# Patient Record
Sex: Female | Born: 1985 | Race: Black or African American | Hispanic: No | Marital: Single | Smoking: Never smoker
Health system: Southern US, Community
[De-identification: ages and names within clinical notes are randomized; demographics above are authoritative.]

## PROBLEM LIST (undated history)

## (undated) ENCOUNTER — Emergency Department (HOSPITAL_COMMUNITY): Admission: EM | Payer: Medicaid Other

## (undated) DIAGNOSIS — F329 Major depressive disorder, single episode, unspecified: Secondary | ICD-10-CM

## (undated) DIAGNOSIS — F32A Depression, unspecified: Secondary | ICD-10-CM

## (undated) HISTORY — DX: Major depressive disorder, single episode, unspecified: F32.9

## (undated) HISTORY — DX: Depression, unspecified: F32.A

## (undated) HISTORY — PX: DILATION AND CURETTAGE OF UTERUS: SHX78

## (undated) HISTORY — PX: KNEE ARTHROSCOPY WITH MEDIAL MENISECTOMY: SHX5651

---

## 2018-04-02 ENCOUNTER — Encounter: Payer: Self-pay | Admitting: Sports Medicine

## 2018-04-02 ENCOUNTER — Ambulatory Visit (INDEPENDENT_AMBULATORY_CARE_PROVIDER_SITE_OTHER): Payer: Medicaid Other | Admitting: Sports Medicine

## 2018-04-02 ENCOUNTER — Ambulatory Visit (INDEPENDENT_AMBULATORY_CARE_PROVIDER_SITE_OTHER): Payer: Medicaid Other

## 2018-04-02 DIAGNOSIS — M23203 Derangement of unspecified medial meniscus due to old tear or injury, right knee: Secondary | ICD-10-CM

## 2018-04-02 DIAGNOSIS — M25561 Pain in right knee: Secondary | ICD-10-CM | POA: Diagnosis not present

## 2018-04-02 DIAGNOSIS — M1711 Unilateral primary osteoarthritis, right knee: Secondary | ICD-10-CM | POA: Insufficient documentation

## 2018-04-02 DIAGNOSIS — M25562 Pain in left knee: Secondary | ICD-10-CM | POA: Diagnosis not present

## 2018-04-02 MED ORDER — MELOXICAM 15 MG PO TABS: ORAL_TABLET | ORAL | 3 refills | Status: DC

## 2018-04-02 NOTE — Progress Notes (Signed)
Subjective:    CC: Right knee pain  HPI:  This is a very pleasant 33 year old female, she used to live in [place].  She was ice-skating, she fell and twisted her right knee.  With persistent pain, swelling, ultimately she was diagnosed with a meniscal tear and underwent a an arthroscopic partial meniscectomy.  Since then she has had persistent discomfort along the medial joint line, occasional mechanical symptoms and buckling.  She has tried some prescription strength ibuprofen with only minimal relief.  She was told by her surgeon that she did have some areas of cartilage loss during the arthroscopy.  I reviewed the past medical history, family history, social history, surgical history, and allergies today and no changes were needed.  Please see the problem list section below in epic for further details.  Past Medical History: Past Medical History:  Diagnosis Date  . Depression    Past Surgical History: History reviewed. No pertinent surgical history. Social History: Social History   Socioeconomic History  . Marital status: Single    Spouse name: Not on file  . Number of children: Not on file  . Years of education: Not on file  . Highest education level: Not on file  Occupational History  . Not on file  Social Needs  . Financial resource strain: Not on file  . Food insecurity:    Worry: Not on file    Inability: Not on file  . Transportation needs:    Medical: Not on file    Non-medical: Not on file  Tobacco Use  . Smoking status: Never Smoker  . Smokeless tobacco: Never Used  Substance and Sexual Activity  . Alcohol use: Not Currently  . Drug use: Not Currently  . Sexual activity: Not on file  Lifestyle  . Physical activity:    Days per week: Not on file    Minutes per session: Not on file  . Stress: Not on file  Relationships  . Social connections:    Talks on phone: Not on file    Gets together: Not on file    Attends religious service: Not on file   Active member of club or organization: Not on file    Attends meetings of clubs or organizations: Not on file    Relationship status: Not on file  Other Topics Concern  . Not on file  Social History Narrative  . Not on file   Family History: No family history on file. Allergies: No Known Allergies Medications: See med rec.  Review of Systems: No headache, visual changes, nausea, vomiting, diarrhea, constipation, dizziness, abdominal pain, skin rash, fevers, chills, night sweats, swollen lymph nodes, weight loss, chest pain, body aches, joint swelling, muscle aches, shortness of breath, mood changes, visual or auditory hallucinations.  Objective:    General: Well Developed, well nourished, and in no acute distress.  Neuro: Alert and oriented x3, extra-ocular muscles intact, sensation grossly intact.  HEENT: Normocephalic, atraumatic, pupils equal round reactive to light, neck supple, no masses, no lymphadenopathy, thyroid nonpalpable.  Skin: Warm and dry, no rashes noted.  Cardiac: Regular rate and rhythm, no murmurs rubs or gallops.  Respiratory: Clear to auscultation bilaterally. Not using accessory muscles, speaking in full sentences.  Abdominal: Soft, nontender, nondistended, positive bowel sounds, no masses, no organomegaly.  Right knee: Visibly swollen with no actual fluid wave. She is tender to palpation of the medial joint line, I am able to reproduce her pain with terminal flexion consistent with a meniscal tear. ROM  normal in flexion and extension and lower leg rotation. Ligaments with solid consistent endpoints including ACL, PCL, LCL, MCL. Non painful patellar compression. Patellar and quadriceps tendons unremarkable. Hamstring and quadriceps strength is normal.  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  Impression and Recommendations:    The patient was counselled, risk factors were discussed, anticipatory  guidance given.  Right knee meniscal tear History of right knee partial meniscectomy. 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 She is having some mechanical symptoms we are going to proceed with an x-ray and an MRI, adding meloxicam, home rehab exercises. 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 Return to see me in a month.  ___________________________________________ Ihor Austinhomas J. Benjamin Stainhekkekandam, M.D., ABFM., CAQSM. Primary Care and Sports Medicine [facility]  Adjunct Professor of Family Medicine  [employer]

## 2018-04-02 NOTE — Assessment & Plan Note (Addendum)
History of right knee partial meniscectomy. 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 She is having some mechanical symptoms we are going to proceed with an x-ray and an MRI, adding meloxicam, home rehab exercises. 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 Return to see me in a month.

## 2018-04-23 ENCOUNTER — Ambulatory Visit (INDEPENDENT_AMBULATORY_CARE_PROVIDER_SITE_OTHER): Payer: Medicaid Other

## 2018-04-23 DIAGNOSIS — M23203 Derangement of unspecified medial meniscus due to old tear or injury, right knee: Secondary | ICD-10-CM

## 2018-04-30 ENCOUNTER — Ambulatory Visit: Payer: Medicaid Other | Admitting: Sports Medicine

## 2018-05-09 ENCOUNTER — Encounter: Payer: Self-pay | Admitting: Sports Medicine

## 2018-05-09 ENCOUNTER — Ambulatory Visit (INDEPENDENT_AMBULATORY_CARE_PROVIDER_SITE_OTHER): Payer: Medicaid Other | Admitting: Sports Medicine

## 2018-05-09 DIAGNOSIS — M1711 Unilateral primary osteoarthritis, right knee: Secondary | ICD-10-CM | POA: Diagnosis not present

## 2018-05-09 NOTE — Progress Notes (Signed)
Subjective:    CC: Recheck knee  HPI: This is a very pleasant 33 year old female, she is here for follow-up of her right knee pain, she is post partial meniscectomy, had some medial joint line pain, we obtained an MRI.  There was no visible meniscal tearing, she did have some osteoarthritis.  Oral NSAIDs are not effective, activity modification and some weight loss have not helped, pain is moderate, persistent, localized to the medial joint line without radiation or mechanical symptoms.  I reviewed the past medical history, family history, social history, surgical history, and allergies today and no changes were needed.  Please see the problem list section below in epic for further details.  Past Medical History: Past Medical History:  Diagnosis Date  . Depression    Past Surgical History: Past Surgical History:  Procedure Laterality Date  . KNEE ARTHROSCOPY WITH MEDIAL MENISECTOMY Right    Social History: Social History   Socioeconomic History  . Marital status: Single    Spouse name: Not on file  . Number of children: Not on file  . Years of education: Not on file  . Highest education level: Not on file  Occupational History  . Not on file  Social Needs  . Financial resource strain: Not on file  . Food insecurity:    Worry: Not on file    Inability: Not on file  . Transportation needs:    Medical: Not on file    Non-medical: Not on file  Tobacco Use  . Smoking status: Never Smoker  . Smokeless tobacco: Never Used  Substance and Sexual Activity  . Alcohol use: Not Currently  . Drug use: Not Currently  . Sexual activity: Not on file  Lifestyle  . Physical activity:    Days per week: Not on file    Minutes per session: Not on file  . Stress: Not on file  Relationships  . Social connections:    Talks on phone: Not on file    Gets together: Not on file    Attends religious service: Not on file    Active member of club or organization: Not on file    Attends  meetings of clubs or organizations: Not on file    Relationship status: Not on file  Other Topics Concern  . Not on file  Social History Narrative  . Not on file   Family History: No family history on file. Allergies: No Known Allergies Medications: See med rec.  Review of Systems: No fevers, chills, night sweats, weight loss, chest pain, or shortness of breath.   Objective:    General: Well Developed, well nourished, and in no acute distress.  Neuro: Alert and oriented x3, extra-ocular muscles intact, sensation grossly intact.  HEENT: Normocephalic, atraumatic, pupils equal round reactive to light, neck supple, no masses, no lymphadenopathy, thyroid nonpalpable.  Skin: Warm and dry, no rashes. Cardiac: Regular rate and rhythm, no murmurs rubs or gallops, no lower extremity edema.  Respiratory: Clear to auscultation bilaterally. Not using accessory muscles, speaking in full sentences. Right knee: Normal to inspection with no erythema or effusion or obvious bony abnormalities. Tender to palpation at medial joint line ROM normal in flexion and extension and lower leg rotation. Ligaments with solid consistent endpoints including ACL, PCL, LCL, MCL. Negative Mcmurray's and provocative meniscal tests. Non painful patellar compression. Patellar and quadriceps tendons unremarkable. Hamstring and quadriceps strength is normal.  Procedure: Real-time Ultrasound Guided Injection of right knee Device: GE Logiq E  Verbal informed consent  obtained.  Time-out conducted.  Noted no overlying erythema, induration, or other signs of local infection.  Skin prepped in a sterile fashion.  Local anesthesia: Topical Ethyl chloride.  With sterile technique and under real time ultrasound guidance: 1 cc Kenalog 40, 2 cc lidocaine, 2 cc bupivacaine injected easily Completed without difficulty  Pain immediately resolved suggesting accurate placement of the medication.  Advised to call if fevers/chills,  erythema, induration, drainage, or persistent bleeding.  Images permanently stored and available for review in the ultrasound unit.  Impression: Technically successful ultrasound guided injection.  Impression and Recommendations:    Primary osteoarthritis of right knee History of a right knee partial meniscectomy, also had some post arthroscopy chondromalacia. No meniscal tearing on MRI. This is early osteoarthritis, injection today. Return in 1 month. ___________________________________________ Ihor Austin. Benjamin Stain, M.D., ABFM., CAQSM. Primary Care and Sports Medicine [facility]  Adjunct Professor of Family Medicine  [employer]

## 2018-05-09 NOTE — Assessment & Plan Note (Signed)
History of a right knee partial meniscectomy, also had some post arthroscopy chondromalacia. No meniscal tearing on MRI. This is early osteoarthritis, injection today. Return in 1 month.

## 2018-06-06 ENCOUNTER — Ambulatory Visit: Payer: Medicaid Other | Admitting: Sports Medicine

## 2019-02-06 ENCOUNTER — Other Ambulatory Visit: Payer: Self-pay

## 2019-02-07 ENCOUNTER — Other Ambulatory Visit: Payer: Self-pay

## 2019-02-07 DIAGNOSIS — Z20822 Contact with and (suspected) exposure to covid-19: Secondary | ICD-10-CM

## 2019-02-10 LAB — NOVEL CORONAVIRUS, NAA: SARS-CoV-2, NAA: NOT DETECTED

## 2019-09-17 DIAGNOSIS — H5213 Myopia, bilateral: Secondary | ICD-10-CM | POA: Diagnosis not present

## 2019-09-17 DIAGNOSIS — H1013 Acute atopic conjunctivitis, bilateral: Secondary | ICD-10-CM | POA: Diagnosis not present

## 2019-09-19 ENCOUNTER — Encounter (HOSPITAL_COMMUNITY): Payer: Self-pay | Admitting: Emergency Medicine

## 2019-09-19 ENCOUNTER — Other Ambulatory Visit: Payer: Self-pay

## 2019-09-19 ENCOUNTER — Emergency Department (HOSPITAL_COMMUNITY)
Admission: EM | Admit: 2019-09-19 | Discharge: 2019-09-20 | Disposition: A | Discharge status: Other Acute Inpatient Hospital | Payer: Medicaid Other | Attending: Emergency Medicine | Admitting: Emergency Medicine

## 2019-09-19 ENCOUNTER — Emergency Department (HOSPITAL_COMMUNITY): Payer: Medicaid Other

## 2019-09-19 DIAGNOSIS — R6510 Systemic inflammatory response syndrome (SIRS) of non-infectious origin without acute organ dysfunction: Secondary | ICD-10-CM | POA: Insufficient documentation

## 2019-09-19 DIAGNOSIS — K047 Periapical abscess without sinus: Secondary | ICD-10-CM

## 2019-09-19 DIAGNOSIS — K122 Cellulitis and abscess of mouth: Secondary | ICD-10-CM | POA: Diagnosis not present

## 2019-09-19 DIAGNOSIS — Z20822 Contact with and (suspected) exposure to covid-19: Secondary | ICD-10-CM | POA: Insufficient documentation

## 2019-09-19 DIAGNOSIS — K0889 Other specified disorders of teeth and supporting structures: Secondary | ICD-10-CM | POA: Diagnosis present

## 2019-09-19 LAB — BASIC METABOLIC PANEL
Anion gap: 10 (ref 5–15)
BUN: 9 mg/dL (ref 6–20)
CO2: 26 mmol/L (ref 22–32)
Calcium: 8.8 mg/dL — ABNORMAL LOW (ref 8.9–10.3)
Chloride: 102 mmol/L (ref 98–111)
Creatinine, Ser: 0.96 mg/dL (ref 0.44–1.00)
GFR calc Af Amer: >60 mL/min (ref >60)
GFR calc non Af Amer: >60 mL/min (ref >60)
Glucose, Bld: 98 mg/dL (ref 70–99)
Potassium: 3.2 mmol/L — ABNORMAL LOW (ref 3.5–5.1)
Sodium: 138 mmol/L (ref 135–145)

## 2019-09-19 LAB — CBC WITH DIFFERENTIAL/PLATELET
Abs Immature Granulocytes: 0.04 10*3/uL (ref 0.00–0.07)
Basophils Absolute: 0 10*3/uL (ref 0.0–0.1)
Basophils Relative: 0 %
Eosinophils Absolute: 0 10*3/uL (ref 0.0–0.5)
Eosinophils Relative: 0 %
HCT: 33.9 % — ABNORMAL LOW (ref 36.0–46.0)
Hemoglobin: 11.1 g/dL — ABNORMAL LOW (ref 12.0–15.0)
Immature Granulocytes: 0 %
Lymphocytes Relative: 8 %
Lymphs Abs: 0.9 10*3/uL (ref 0.7–4.0)
MCH: 23.9 pg — ABNORMAL LOW (ref 26.0–34.0)
MCHC: 32.7 g/dL (ref 30.0–36.0)
MCV: 73.1 fL — ABNORMAL LOW (ref 80.0–100.0)
Monocytes Absolute: 0.9 10*3/uL (ref 0.1–1.0)
Monocytes Relative: 8 %
Neutro Abs: 9.9 10*3/uL — ABNORMAL HIGH (ref 1.7–7.7)
Neutrophils Relative %: 84 %
Platelets: 258 10*3/uL (ref 150–400)
RBC: 4.64 MIL/uL (ref 3.87–5.11)
RDW: 13.3 % (ref 11.5–15.5)
WBC: 11.8 10*3/uL — ABNORMAL HIGH (ref 4.0–10.5)
nRBC: 0 % (ref 0.0–0.2)

## 2019-09-19 LAB — URINALYSIS, ROUTINE W REFLEX MICROSCOPIC
Bilirubin Urine: NEGATIVE
Glucose, UA: NEGATIVE mg/dL
Ketones, ur: 20 mg/dL — AB
Leukocytes,Ua: NEGATIVE
Nitrite: NEGATIVE
Protein, ur: NEGATIVE mg/dL
Specific Gravity, Urine: >1.046 — ABNORMAL HIGH (ref 1.005–1.030)
pH: 6 (ref 5.0–8.0)

## 2019-09-19 LAB — PREGNANCY, URINE: Preg Test, Ur: NEGATIVE

## 2019-09-19 LAB — LACTIC ACID, PLASMA
Lactic Acid, Venous: 1 mmol/L (ref 0.5–1.9)
Lactic Acid, Venous: 0.8 mmol/L (ref 0.5–1.9)

## 2019-09-19 MED ORDER — SODIUM CHLORIDE 0.9 % IV BOLUS
1000.0000 mL | Freq: Once | INTRAVENOUS | Status: AC
  Administered 2019-09-19: 1000 mL via INTRAVENOUS

## 2019-09-19 MED ORDER — ACETAMINOPHEN 325 MG PO TABS
650.0000 mg | ORAL_TABLET | Freq: Once | ORAL | Status: AC
  Administered 2019-09-19: 650 mg via ORAL
  Filled 2019-09-19: qty 2

## 2019-09-19 MED ORDER — IOHEXOL 300 MG/ML  SOLN
75.0000 mL | Freq: Once | INTRAMUSCULAR | Status: AC | PRN
  Administered 2019-09-19: 75 mL via INTRAVENOUS

## 2019-09-19 MED ORDER — MORPHINE SULFATE (PF) 4 MG/ML IV SOLN
4.0000 mg | Freq: Once | INTRAVENOUS | Status: AC
  Administered 2019-09-19: 4 mg via INTRAVENOUS
  Filled 2019-09-19: qty 1

## 2019-09-19 MED ORDER — MORPHINE SULFATE (PF) 4 MG/ML IV SOLN
4.0000 mg | INTRAVENOUS | Status: DC | PRN
  Administered 2019-09-20 (×2): 4 mg via INTRAVENOUS
  Filled 2019-09-19 (×2): qty 1

## 2019-09-19 MED ORDER — CLINDAMYCIN PHOSPHATE 600 MG/50ML IV SOLN
600.0000 mg | Freq: Once | INTRAVENOUS | Status: AC
  Administered 2019-09-19: 600 mg via INTRAVENOUS
  Filled 2019-09-19: qty 50

## 2019-09-19 MED ORDER — CLINDAMYCIN PHOSPHATE 600 MG/50ML IV SOLN: 600.0000 mg | Freq: Four times a day (QID) | INTRAVENOUS | Status: DC

## 2019-09-19 NOTE — ED Notes (Signed)
Patient transported to CT 

## 2019-09-19 NOTE — ED Provider Notes (Signed)
[facility] EMERGENCY DEPARTMENT Provider Note   CSN: [id number] Arrival date & time: 09/19/19  1641     History Chief Complaint  Patient presents with  . Dental Pain    Latasha Carlson is a 34 y.o. female.  She has no significant past medical history.  She is complaining of 6 days of left lower molar pain.  She saw dentist and got put on amoxicillin and pain medicine and referred on to oral surgery.  Says she can get into oral surgery as 3 weeks.  Complaining of increased pain and swelling onto her neck.  Pain with swallowing.  Handling her oral secretions.  Fever here of 101.7.  The history is provided by the patient.  Dental Pain Location:  Lower Lower teeth location:  19/LL 1st molar and 18/LL 2nd molar Quality:  Aching Severity:  Severe Onset quality:  Gradual Duration:  6 days Timing:  Constant Progression:  Worsening Chronicity:  New Context: abscess and dental fracture   Previous work-up:  Dental exam Relieved by:  Nothing Worsened by:  Jaw movement and pressure Ineffective treatments: abx, pain meds. Associated symptoms: difficulty swallowing, fever, neck pain and neck swelling   Associated symptoms: no drooling, no headaches and no trismus        Past Medical History:  Diagnosis Date  . Depression     Patient Active Problem List   Diagnosis Date Noted  . Primary osteoarthritis of right knee 04/02/2018    Past Surgical History:  Procedure Laterality Date  . KNEE ARTHROSCOPY WITH MEDIAL MENISECTOMY Right      OB History   No obstetric history on file.     No family history on file.  Social History   Tobacco Use  . Smoking status: Never Smoker  . Smokeless tobacco: Never Used  Substance Use Topics  . Alcohol use: Not Currently  . Drug use: Not Currently    Home Medications Prior to Admission medications   Medication Sig Start Date End Date Taking? Authorizing Provider  meloxicam (MOBIC) 15 MG tablet One tab PO qAM with  breakfast for 2 weeks, then daily prn pain. 04/02/18   Monica Becton, MD    Allergies    Patient has no known allergies.  Review of Systems   Review of Systems  Constitutional: Positive for fever.  HENT: Positive for dental problem and trouble swallowing. Negative for drooling.   Eyes: Negative for visual disturbance.  Respiratory: Negative for shortness of breath.   Cardiovascular: Negative for chest pain.  Gastrointestinal: Negative for abdominal pain.  Genitourinary: Negative for dysuria.  Musculoskeletal: Positive for neck pain.  Skin: Negative for rash.  Neurological: Negative for headaches.    Physical Exam Updated Vital Signs BP 125/87 (BP Location: Left Arm)   Pulse (!) 103   Temp (!) 101.7 F (38.7 C) (Oral)   Resp 16   Ht 5\' 8"  (1.727 m)   SpO2 100%   BMI 28.89 kg/m   Physical Exam Vitals and nursing note reviewed.  Constitutional:      General: She is not in acute distress.    Appearance: She is well-developed.  HENT:     Head: Normocephalic and atraumatic.     Mouth/Throat:     Mouth: Mucous membranes are moist.   Eyes:     Conjunctiva/sclera: Conjunctivae normal.  Neck:     Trachea: Trachea normal.   Cardiovascular:     Rate and Rhythm: Regular rhythm. Tachycardia present.     Heart  sounds: No murmur heard.   Pulmonary:     Effort: Pulmonary effort is normal. No respiratory distress.     Breath sounds: Normal breath sounds.  Abdominal:     Palpations: Abdomen is soft.     Tenderness: There is no abdominal tenderness.  Musculoskeletal:        General: No deformity or signs of injury. Normal range of motion.     Cervical back: No rigidity.  Skin:    General: Skin is warm and dry.  Neurological:     General: No focal deficit present.     Mental Status: She is alert.     Sensory: No sensory deficit.     Motor: No weakness.     Gait: Gait normal.     ED Results / Procedures / Treatments   Labs (all labs ordered are listed, but  only abnormal results are displayed) Labs Reviewed  BASIC METABOLIC PANEL - Abnormal; Notable for the following components:      Result Value   Potassium 3.2 (*)    Calcium 8.8 (*)    All other components within normal limits  CBC WITH DIFFERENTIAL/PLATELET - Abnormal; Notable for the following components:   WBC 11.8 (*)    Hemoglobin 11.1 (*)    HCT 33.9 (*)    MCV 73.1 (*)    MCH 23.9 (*)    Neutro Abs 9.9 (*)    All other components within normal limits  SARS CORONAVIRUS 2 BY RT PCR (HOSPITAL ORDER, PERFORMED IN [place] [facility])  LACTIC ACID, PLASMA  LACTIC ACID, PLASMA  PREGNANCY, URINE  URINALYSIS, ROUTINE W REFLEX MICROSCOPIC    EKG None  Radiology CT Soft Tissue Neck W Contrast  Result Date: 09/19/2019 CLINICAL DATA:  Initial evaluation for sublingual/submandibular abscess. EXAM: CT NECK WITH CONTRAST TECHNIQUE: Multidetector CT imaging of the neck was performed using the standard protocol following the bolus administration of intravenous contrast. CONTRAST:  38mL OMNIPAQUE IOHEXOL 300 MG/ML  SOLN COMPARISON:  None available. FINDINGS: Pharynx and larynx: Prominent dental carie with periapical lucency seen involving the left second mandibular molar. Associated focal dehiscence of the adjacent alveolar ridge along the lingual aspect of the mandible (series 6, image 34). There is a hypodense somewhat tubular collection emanating from the left mandibular body at this level, extending inferiorly and medially into the floor of mouth (series 7, images 40, 42, 46). The dominant abscess position along the midline at the floor of mouth measures 2.8 x 1.8 x 0.8 cm (AP by transverse by craniocaudad, series 3, image 51). Surrounding swelling with inflammatory changes within the adjacent floor of mouth and sublingual space, extending into the submandibular spaces bilaterally, left slightly worse than right. The oral tongue appears somewhat lifted. Hazy inflammatory stranding involves  the submental region as well. Palatine tonsils symmetric and within normal limits. Parapharyngeal fat relatively well maintained. Remainder of the oropharynx and nasopharynx within normal limits. No retropharyngeal collection or swelling. Epiglottis normal. Vallecula clear. Remainder of the hypopharynx and supraglottic larynx within normal limits. Glottis normal. Subglottic airway widely patent and clear. Salivary glands: Parotid glands within normal limits. Hazy inflammatory stranding surrounds the submandibular glands bilaterally related to the adjacent inflammatory process at the floor of mouth. The glands themselves are within normal limits without evidence for sialolithiasis or acute sialoadenitis. Thyroid: Normal. Lymph nodes: Prominent left level IB/II nodes measure up to 12 mm. Prominent submental nodes measure up to 7 mm. Findings presumably reactive. No other pathologically enlarged lymph nodes  seen within the neck. Vascular: Normal intravascular enhancement seen throughout the neck. Limited intracranial: Unremarkable. Visualized orbits: Visualized globes and orbital soft tissues within normal limits. Mastoids and visualized paranasal sinuses: Visualized paranasal sinuses are largely clear. Visualize mastoids and middle ear cavities are well pneumatized and free of fluid. Skeleton: No acute osseous abnormality. No discrete or worrisome osseous lesions. Upper chest: Visualized upper chest demonstrates no acute finding. Partially visualized lungs are clear. Other: None. IMPRESSION: 1. Prominent dental carie with periapical lucency involving the left second mandibular molar. Associated 2.8 x 1.8 x 0.8 cm abscess emanating from the base of the carious tooth, extending inferiorly and medially into the floor of mouth as above. Surrounding swelling with inflammatory changes within the adjacent floor of mouth and sublingual space, extending into the submandibular spaces and submental region bilaterally. 2. Mildly  enlarged left-sided cervical adenopathy as above, presumably reactive. Electronically Signed   By: Rise Mu M.D.   On: 09/19/2019 22:25    Procedures .Critical Care Performed by: Terrilee Files, MD Authorized by: Terrilee Files, MD   Critical care provider statement:    Critical care time (minutes):  45   Critical care time was exclusive of:  Separately billable procedures and treating other patients   Critical care was necessary to treat or prevent imminent or life-threatening deterioration of the following conditions: possible airway compromise, SIRS.   Critical care was time spent personally by me on the following activities:  Discussions with consultants, evaluation of patient's response to treatment, examination of patient, ordering and performing treatments and interventions, ordering and review of laboratory studies, ordering and review of radiographic studies, pulse oximetry, re-evaluation of patient's condition, obtaining history from patient or surrogate, review of old charts and development of treatment plan with patient or surrogate   I assumed direction of critical care for this patient from another provider in my specialty: no     (including critical care time)  Medications Ordered in ED Medications  sodium chloride 0.9 % bolus 1,000 mL (0 mLs Intravenous Stopped 09/19/19 2255)  morphine 4 MG/ML injection 4 mg (4 mg Intravenous Given 09/19/19 2130)  clindamycin (CLEOCIN) IVPB 600 mg (0 mg Intravenous Stopped 09/19/19 2255)  acetaminophen (TYLENOL) tablet 650 mg (650 mg Oral Given 09/19/19 2132)  iohexol (OMNIPAQUE) 300 MG/ML solution 75 mL (75 mLs Intravenous Contrast Given 09/19/19 2143)    ED Course  I have reviewed the triage vital signs and the nursing notes.  Pertinent labs & imaging results that were available during my care of the patient were reviewed by me and considered in my medical decision making (see chart for details).  Clinical Course as of Sep 19 2335  Thu Sep 19, 2019  2300 There is no oral surgery available on call tonight here.  Discussed with [facility] Dr. Delene Ruffini, ENT.  He says they also do not have neurosurgery and they have no ENT beds to admit the patient to.   [MB]  2316 Discussed with neurosurgery on-call Dr. Retta Mac. He said he would see the patient in consult if the patient could be admitted to the hospitalist service [facility].   [MB]  2320 Discussed with Dr. Elton Sin neurosurgery. She agrees that the patient needs a drainage. They currently only have 1 open bed on campus. She said if [facility] is able to accommodate the patient then that would be desirable. Call back if any changes.   [MB]  2336 Discussed with Dr. Lucianne Muss hospitalist  at [facility].  He is excepting the patient to his service.  Patient be transferred when bed is available.  He is asking that we continue clindamycin 600 every 6 hours.   [MB]  2336 I have signed out this patient to Dr. Oletta CohnPolina with plan for serial observation until [facility] to [facility].   [MB]    Clinical Course User Index [MB] Terrilee FilesButler, Deztinee Lohmeyer C, MD   MDM Rules/Calculators/A&P                         This patient complains of dental infection with increased swelling; this involves an extensive number of treatment Options and is a complaint that carries with it a high risk of complications and Morbidity. The differential includes dental abscess, submandibular abscess, deep space infection Ludewig's.  SIRS criteria  I ordered, reviewed and interpreted labs, which included CBC with elevated white count of 11.8, low hemoglobin unclear baseline.  Chemistries with mildly low potassium of 3.2.  Lactic acid normal. I ordered medication IV clindamycin IV fluids IV pain medication. I ordered imaging studies which included CT soft tissue neck and I independently    visualized and interpreted imaging which showed abscess left lower molar extending into  submandibular sublingual space Previous records obtained and reviewed in epic, none I consulted Dr. Retta MacMohorn oral surgeon and and discussed lab and imaging findings  Critical Interventions: Management of neck abscess with early antibiotics and consultation to surgery.  After the interventions stated above, I reevaluated the patient and found improvement in her pain and her vital signs. We had no oral surgery on campus. [facility] and [facility].   Final Clinical Impression(s) / ED Diagnoses Final diagnoses:  Dental abscess  Abscess of submandibular region  SIRS (systemic inflammatory response syndrome) (HCC)    Rx / DC Orders ED Discharge Orders    None       Terrilee FilesButler, Brannon Decaire C, MD 09/19/19 2337

## 2019-09-19 NOTE — ED Triage Notes (Signed)
Pt reports seeing a dentist and was referred to an oral surgeon due to an abscess. Pt states they can't see her til end of July. Currently taking amoxicillin

## 2019-09-20 DIAGNOSIS — K047 Periapical abscess without sinus: Secondary | ICD-10-CM | POA: Diagnosis not present

## 2019-09-20 DIAGNOSIS — R6510 Systemic inflammatory response syndrome (SIRS) of non-infectious origin without acute organ dysfunction: Secondary | ICD-10-CM | POA: Diagnosis not present

## 2019-09-20 DIAGNOSIS — K0889 Other specified disorders of teeth and supporting structures: Secondary | ICD-10-CM | POA: Diagnosis present

## 2019-09-20 DIAGNOSIS — Z20822 Contact with and (suspected) exposure to covid-19: Secondary | ICD-10-CM | POA: Diagnosis not present

## 2019-09-20 DIAGNOSIS — K122 Cellulitis and abscess of mouth: Secondary | ICD-10-CM | POA: Diagnosis not present

## 2019-09-20 LAB — SARS CORONAVIRUS 2 BY RT PCR (HOSPITAL ORDER, PERFORMED IN ~~LOC~~ HOSPITAL LAB): SARS Coronavirus 2: NEGATIVE

## 2019-11-19 ENCOUNTER — Ambulatory Visit: Payer: Medicaid Other | Admitting: Certified Nurse Midwife

## 2019-12-09 ENCOUNTER — Encounter: Payer: Self-pay | Admitting: Women's Health

## 2019-12-09 ENCOUNTER — Ambulatory Visit (INDEPENDENT_AMBULATORY_CARE_PROVIDER_SITE_OTHER): Payer: Medicaid Other | Admitting: Women's Health

## 2019-12-09 ENCOUNTER — Other Ambulatory Visit: Payer: Self-pay

## 2019-12-09 ENCOUNTER — Other Ambulatory Visit (HOSPITAL_COMMUNITY)
Admission: RE | Admit: 2019-12-09 | Discharge: 2019-12-09 | Disposition: A | Discharge status: Home / Self Care | Payer: Medicaid Other | Source: Ambulatory Visit | Attending: Women's Health | Admitting: Women's Health

## 2019-12-09 VITALS — BP 136/79 | HR 87 | Ht 68.0 in | Wt 189.0 lb

## 2019-12-09 DIAGNOSIS — Z01419 Encounter for gynecological examination (general) (routine) without abnormal findings: Secondary | ICD-10-CM | POA: Insufficient documentation

## 2019-12-09 DIAGNOSIS — Z113 Encounter for screening for infections with a predominantly sexual mode of transmission: Secondary | ICD-10-CM | POA: Insufficient documentation

## 2019-12-09 LAB — POCT URINE PREGNANCY: Preg Test, Ur: NEGATIVE

## 2019-12-09 NOTE — Patient Instructions (Signed)
Preventive Care 21-35 Years Old, Female Preventive care refers to visits with your health care provider and lifestyle choices that can promote health and wellness. This includes:  A yearly physical exam. This may also be called an annual well check.  Regular dental visits and eye exams.  Immunizations.  Screening for certain conditions.  Healthy lifestyle choices, such as eating a healthy diet, getting regular exercise, not using drugs or products that contain nicotine and tobacco, and limiting alcohol use. What can I expect for my preventive care visit? Physical exam Your health care provider will check your:  Height and weight. This may be used to calculate body mass index (BMI), which tells if you are at a healthy weight.  Heart rate and blood pressure.  Skin for abnormal spots. Counseling Your health care provider may ask you questions about your:  Alcohol, tobacco, and drug use.  Emotional well-being.  Home and relationship well-being.  Sexual activity.  Eating habits.  Work and work environment.  Method of birth control.  Menstrual cycle.  Pregnancy history. What immunizations do I need?  Influenza (flu) vaccine  This is recommended every year. Tetanus, diphtheria, and pertussis (Tdap) vaccine  You may need a Td booster every 10 years. Varicella (chickenpox) vaccine  You may need this if you have not been vaccinated. Human papillomavirus (HPV) vaccine  If recommended by your health care provider, you may need three doses over 6 months. Measles, mumps, and rubella (MMR) vaccine  You may need at least one dose of MMR. You may also need a second dose. Meningococcal conjugate (MenACWY) vaccine  One dose is recommended if you are age 19-21 years and a first-year college student living in a residence hall, or if you have one of several medical conditions. You may also need additional booster doses. Pneumococcal conjugate (PCV13) vaccine  You may need  this if you have certain conditions and were not previously vaccinated. Pneumococcal polysaccharide (PPSV23) vaccine  You may need one or two doses if you smoke cigarettes or if you have certain conditions. Hepatitis A vaccine  You may need this if you have certain conditions or if you travel or work in places where you may be exposed to hepatitis A. Hepatitis B vaccine  You may need this if you have certain conditions or if you travel or work in places where you may be exposed to hepatitis B. Haemophilus influenzae type b (Hib) vaccine  You may need this if you have certain conditions. You may receive vaccines as individual doses or as more than one vaccine together in one shot (combination vaccines). Talk with your health care provider about the risks and benefits of combination vaccines. What tests do I need?  Blood tests  Lipid and cholesterol levels. These may be checked every 5 years starting at age 20.  Hepatitis C test.  Hepatitis B test. Screening  Diabetes screening. This is done by checking your blood sugar (glucose) after you have not eaten for a while (fasting).  Sexually transmitted disease (STD) testing.  BRCA-related cancer screening. This may be done if you have a family history of breast, ovarian, tubal, or peritoneal cancers.  Pelvic exam and Pap test. This may be done every 3 years starting at age 21. Starting at age 30, this may be done every 5 years if you have a Pap test in combination with an HPV test. Talk with your health care provider about your test results, treatment options, and if necessary, the need for more tests.   Follow these instructions at home: Eating and drinking   Eat a diet that includes fresh fruits and vegetables, whole grains, lean protein, and low-fat dairy.  Take vitamin and mineral supplements as recommended by your health care provider.  Do not drink alcohol if: ? Your health care provider tells you not to drink. ? You are  pregnant, may be pregnant, or are planning to become pregnant.  If you drink alcohol: ? Limit how much you have to 0-1 drink a day. ? Be aware of how much alcohol is in your drink. In the [place] one drink equals one 12 oz bottle of beer (355 mL), one 5 oz glass of wine (148 mL), or one 1 oz glass of hard liquor (44 mL). Lifestyle  Take daily care of your teeth and gums.  Stay active. Exercise for at least 30 minutes on 5 or more days each week.  Do not use any products that contain nicotine or tobacco, such as cigarettes, e-cigarettes, and chewing tobacco. If you need help quitting, ask your health care provider.  If you are sexually active, practice safe sex. Use a condom or other form of birth control (contraception) in order to prevent pregnancy and STIs (sexually transmitted infections). If you plan to become pregnant, see your health care provider for a preconception visit. What's next?  Visit your health care provider once a year for a well check visit.  Ask your health care provider how often you should have your eyes and teeth checked.  Stay up to date on all vaccines. This information is not intended to replace advice given to you by your health care provider. Make sure you discuss any questions you have with your health care provider. Document Revised: 11/16/2017 Document Reviewed: 11/16/2017 Elsevier Patient Education  2020 Elsevier Inc.        Breast Self-Awareness Breast self-awareness means being familiar with how your breasts look and feel. It involves checking your breasts regularly and reporting any changes to your health care provider. Practicing breast self-awareness is important. Sometimes changes may not be harmful (are benign), but sometimes a change in your breasts can be a sign of a serious medical problem. It is important to learn how to do this procedure correctly so that you can catch problems early, when treatment is more likely to be successful. All  women should practice breast self-awareness, including women who have had breast implants. What you need:  A mirror.  A well-lit room. How to do a breast self-exam A breast self-exam is one way to learn what is normal for your breasts and whether your breasts are changing. To do a breast self-exam: Look for changes  1. Remove all the clothing above your waist. 2. Stand in front of a mirror in a room with good lighting. 3. Put your hands on your hips. 4. Push your hands firmly downward. 5. Compare your breasts in the mirror. Look for differences between them (asymmetry), such as: ? Differences in shape. ? Differences in size. ? Puckers, dips, and bumps in one breast and not the other. 6. Look at each breast for changes in the skin, such as: ? Redness. ? Scaly areas. 7. Look for changes in your nipples, such as: ? Discharge. ? Bleeding. ? Dimpling. ? Redness. ? A change in position. Feel for changes Carefully feel your breasts for lumps and changes. It is best to do this while lying on your back on the floor, and again while sitting or standing in the tub   or shower with soapy water on your skin. Feel each breast in the following way: 1. Place the arm on the side of the breast you are examining above your head. 2. Feel your breast with the other hand. 3. Start in the nipple area and make -inch (2 cm) overlapping circles to feel your breast. Use the pads of your three middle fingers to do this. Apply light pressure, then medium pressure, then firm pressure. The light pressure will allow you to feel the tissue closest to the skin. The medium pressure will allow you to feel the tissue that is a little deeper. The firm pressure will allow you to feel the tissue close to the ribs. 4. Continue the overlapping circles, moving downward over the breast until you feel your ribs below your breast. 5. Move one finger-width toward the center of the body. Continue to use the -inch (2 cm)  overlapping circles to feel your breast as you move slowly up toward your collarbone. 6. Continue the up-and-down exam using all three pressures until you reach your armpit.  Write down what you find Writing down what you find can help you remember what to discuss with your health care provider. Write down:  What is normal for each breast.  Any changes that you find in each breast, including: ? The kind of changes you find. ? Any pain or tenderness. ? Size and location of any lumps.  Where you are in your menstrual cycle, if you are still menstruating. General tips and recommendations  Examine your breasts every month.  If you are breastfeeding, the best time to examine your breasts is after a feeding or after using a breast pump.  If you menstruate, the best time to examine your breasts is 5-7 days after your period. Breasts are generally lumpier during menstrual periods, and it may be more difficult to notice changes.  With time and practice, you will become more familiar with the variations in your breasts and more comfortable with the exam. Contact a health care provider if you:  See a change in the shape or size of your breasts or nipples.  See a change in the skin of your breast or nipples, such as a reddened or scaly area.  Have unusual discharge from your nipples.  Find a lump or thick area that was not there before.  Have pain in your breasts.  Have any concerns related to your breast health. Summary  Breast self-awareness includes looking for physical changes in your breasts, as well as feeling for any changes within your breasts.  Breast self-awareness should be performed in front of a mirror in a well-lit room.  You should examine your breasts every month. If you menstruate, the best time to examine your breasts is 5-7 days after your menstrual period.  Let your health care provider know of any changes you notice in your breasts, including changes in size,  changes on the skin, pain or tenderness, or unusual fluid from your nipples. This information is not intended to replace advice given to you by your health care provider. Make sure you discuss any questions you have with your health care provider. Document Revised: 10/24/2017 Document Reviewed: 10/24/2017 Elsevier Patient Education  [place] [facility] Practicing safe sex means taking steps before and during sex to reduce your risk of:  Getting an STI (sexually transmitted infection).  Giving your partner an STI.  Unwanted or unplanned pregnancy. How can I  practice safe sex?     Ways you can practice safe sex  Limit your sexual partners to only one partner who is having sex with only you.  Avoid using alcohol and drugs before having sex. Alcohol and drugs can affect your judgment.  Before having sex with a new partner: ? Talk to your partner about past partners, past STIs, and drug use. ? Get screened for STIs and discuss the results with your partner. Ask your partner to get screened, too.  Check your body regularly for sores, blisters, rashes, or unusual discharge. If you notice any of these problems, visit your health care provider.  Avoid sexual contact if you have symptoms of an infection or you are being treated for an STI.  While having sex, use a condom. Make sure to: ? Use a condom every time you have vaginal, oral, or anal sex. Both females and males should wear condoms during oral sex. ? Keep condoms in place from the beginning to the end of sexual activity. ? Use a latex condom, if possible. Latex condoms offer the best protection. ? Use only water-based lubricants with a condom. Using petroleum-based lubricants or oils will weaken the condom and increase the chance that it will break. Ways your health care provider can help you practice safe sex  See your health care provider for regular screenings, exams, and tests for STIs.  Talk with your  health care provider about what kind of birth control (contraception) is best for you.  Get vaccinated against hepatitis B and human papillomavirus (HPV).  If you are at risk of being infected with HIV (human immunodeficiency virus), talk with your health care provider about taking a prescription medicine to prevent HIV infection. You are at risk for HIV if you: ? Are a man who has sex with other men. ? Are sexually active with more than one partner. ? Take drugs by injection. ? Have a sex partner who has HIV. ? Have unprotected sex. ? Have sex with someone who has sex with both men and women. ? Have had an STI. Follow these instructions at home:  Take over-the-counter and prescription medicines as told by your health care provider.  Keep all follow-up visits as told by your health care provider. This is important. Where to find more information  Centers for Disease Control and Prevention: PinkCheek.be  Planned Parenthood: https://www.plannedparenthood.org/  Office on Women's Health: BasketballVoice.it Summary  Practicing safe sex means taking steps before and during sex to reduce your risk of STIs, giving your partner STIs, and having an unwanted or unplanned pregnancy.  Before having sex with a new partner, talk to your partner about past partners, past STIs, and drug use.  Use a condom every time you have vaginal, oral, or anal sex. Both females and males should wear condoms during oral sex.  Check your body regularly for sores, blisters, rashes, or unusual discharge. If you notice any of these problems, visit your health care provider.  See your health care provider for regular screenings, exams, and tests for STIs. This information is not intended to replace advice given to you by your health care provider. Make sure you discuss any questions you have with your health care  provider. Document Revised: 06/29/2018 Document Reviewed: 12/18/2017 Elsevier Patient Education  [place].

## 2019-12-09 NOTE — Progress Notes (Signed)
GYNECOLOGY ANNUAL PREVENTATIVE CARE ENCOUNTER NOTE  History:     Latasha Carlson is a 34 y.o. [phone] female here for a routine annual gynecologic exam.  Current complaints: clear discharge x2 weeks, with minor itching but denies odor.   Denies abnormal vaginal bleeding, pelvic pain, problems with intercourse or other gynecologic concerns. Pt requests STD testing today. Pt reports she does perform SBE. Pt denies bowel or bladder concerns. No family hx of breast, colon, endometrial or ovarian cancer. Pt does not smoke, drink or use drugs. Last dental exam: 2 months. Last eye exam: 5 months.      Gynecologic History Patient's last menstrual period was 11/19/2019 (exact date). Menstruation: once per month, 5-6 days, moderate bleeding, mild dysmenorrhea. Contraception: condoms. Pt reports she does not desire pregnancy in the next year. Last Pap: several years. Results were: normal per patient. Pt denies history of abnormal Pap.  Obstetric History OB History  Gravida Para Term Preterm AB Living  5 3 3  0 2 3  SAB TAB Ectopic Multiple Live Births               # Outcome Date GA Lbr Len/2nd Weight Sex Delivery Anes PTL Lv  5 Term           4 Term           3 Term           2 AB           1 AB             History reviewed. No pertinent past medical history.  Past Surgical History:  Procedure Laterality Date  . KNEE ARTHROSCOPY WITH MEDIAL MENISECTOMY Right     Current Outpatient Medications on File Prior to Visit  Medication Sig Dispense Refill  . amoxicillin (AMOXIL) 500 MG tablet Take 500 mg by mouth every 6 (six) hours.    ibuprofen (ADVIL) 600 MG tablet Take 600 mg by mouth every 6 (six) hours as needed for moderate pain. (Patient not taking: Reported on 12/09/2019)    . meloxicam (MOBIC) 15 MG tablet One tab PO qAM with breakfast for 2 weeks, then daily prn pain. (Patient not taking: Reported on 09/19/2019) 30 tablet 3   No current facility-administered medications on  file prior to visit.    No Known Allergies  Social History:  reports that she has never smoked. She has never used smokeless tobacco. She reports previous alcohol use. She reports previous drug use.  History reviewed. No pertinent family history.  The following portions of the patient's history were reviewed and updated as appropriate: allergies, current medications, past family history, past medical history, past social history, past surgical history and problem list.  Review of Systems Pertinent items noted in HPI and remainder of comprehensive ROS otherwise negative.  Physical Exam:  BP 136/79   Pulse 87   Ht 5\' 8"  (1.727 m)   Wt 189 lb (85.7 kg)   LMP 11/19/2019 (Exact Date)   BMI 28.74 kg/m  CONSTITUTIONAL: Well-developed, well-nourished female in no acute distress.  HENT:  Normocephalic, atraumatic, External right and left ear normal. EYES: Conjunctivae and EOM are normal. Pupils are equal, round, and reactive to light. No scleral icterus.  NECK: Normal range of motion, supple, no masses.  Normal thyroid.  SKIN: Skin is warm and dry. No rash noted. Not diaphoretic. No erythema. No pallor. MUSCULOSKELETAL: Normal range of motion. No tenderness.  No cyanosis, clubbing, or edema. NEUROLOGIC: Alert  and oriented to person, place, and time. Normal reflexes, muscle tone coordination. PSYCHIATRIC: Normal mood and affect. Normal behavior. Normal judgment and thought content. CARDIOVASCULAR: Normal heart rate noted, regular rhythm. RESPIRATORY: Clear to auscultation bilaterally. Effort and breath sounds normal, no problems with respiration noted. BREASTS: Symmetric in size. No masses, skin changes, nipple drainage, or lymphadenopathy. ABDOMEN: Soft, normal bowel sounds, no distention noted.  No tenderness, rebound or guarding.  PELVIC: Normal appearing external genitalia; normal appearing vaginal mucosa and cervix.  No abnormal discharge noted.  Pap smear/; obtained.  Normal uterine  size, no other palpable masses, no uterine or adnexal tenderness.   Assessment and Plan:    1. Well woman exam - Cytology - PAP( [facility]) - Flu Vaccine QUAD 36+ mos IM (Fluarix, Quad PF) - POCT urine pregnancy  2. Screening for STDs (sexually transmitted diseases) - Cervicovaginal ancillary only( [place]) - HIV antibody (with reflex) - Hepatitis B surface antigen - RPR - Hepatitis C Antibody  Will follow up results of pap smear and other testing, if performed, and manage accordingly. Routine preventative health maintenance measures emphasized. Self-breast awareness taught, importance discussed, advised when to RTC, SBA literature given. Please refer to After Visit Summary for other counseling recommendations.      Gearldine Shown, [facility] Women's Health Nurse Practitioner, [facility] for [employer], [facility]

## 2019-12-09 NOTE — Progress Notes (Signed)
New GYN presents for AEX/PAP/STD Screening.  C/o clear discharge, itching.  Denies fever, odor, NV.  PHQ-9=0

## 2019-12-10 LAB — RPR: RPR Ser Ql: NONREACTIVE

## 2019-12-10 LAB — CERVICOVAGINAL ANCILLARY ONLY
Bacterial Vaginitis (gardnerella): POSITIVE — AB
Candida Glabrata: NEGATIVE
Candida Vaginitis: NEGATIVE
Chlamydia: NEGATIVE
Comment: NEGATIVE
Comment: NEGATIVE
Comment: NEGATIVE
Comment: NEGATIVE
Comment: NEGATIVE
Comment: NORMAL
Neisseria Gonorrhea: NEGATIVE
Trichomonas: NEGATIVE

## 2019-12-10 LAB — HEPATITIS B SURFACE ANTIGEN: Hepatitis B Surface Ag: NEGATIVE

## 2019-12-10 LAB — HIV ANTIBODY (ROUTINE TESTING W REFLEX): HIV Screen 4th Generation wRfx: NONREACTIVE

## 2019-12-10 LAB — HEPATITIS C ANTIBODY: Hep C Virus Ab: <0.1 s/co ratio (ref 0.0–0.9)

## 2019-12-11 ENCOUNTER — Other Ambulatory Visit: Payer: Self-pay | Admitting: Women's Health

## 2019-12-11 ENCOUNTER — Other Ambulatory Visit: Payer: Self-pay

## 2019-12-11 DIAGNOSIS — B9689 Other specified bacterial agents as the cause of diseases classified elsewhere: Secondary | ICD-10-CM

## 2019-12-11 LAB — CYTOLOGY - PAP
Comment: NEGATIVE
Diagnosis: NEGATIVE
HPV 16: NEGATIVE
HPV 18 / 45: NEGATIVE
High risk HPV: POSITIVE — AB

## 2019-12-11 MED ORDER — METRONIDAZOLE 0.75 % VA GEL
1.0000 | Freq: Every day | VAGINAL | 0 refills | Status: DC
Start: 2019-12-11 — End: 2020-11-11

## 2019-12-11 MED ORDER — METRONIDAZOLE 500 MG PO TABS: 500.0000 mg | ORAL_TABLET | Freq: Two times a day (BID) | ORAL | 0 refills | Status: DC

## 2019-12-11 NOTE — Progress Notes (Signed)
Pt called and requested Metrogel instead of flagyl for BV, Rx sent to pt preferred pharmacy. Pt made aware and voices understanding.

## 2020-02-06 DIAGNOSIS — M1710 Unilateral primary osteoarthritis, unspecified knee: Secondary | ICD-10-CM | POA: Diagnosis not present

## 2020-02-06 DIAGNOSIS — M25561 Pain in right knee: Secondary | ICD-10-CM | POA: Diagnosis not present

## 2020-02-06 DIAGNOSIS — M25562 Pain in left knee: Secondary | ICD-10-CM | POA: Diagnosis not present

## 2020-04-22 DIAGNOSIS — Z13220 Encounter for screening for lipoid disorders: Secondary | ICD-10-CM | POA: Diagnosis not present

## 2020-04-22 DIAGNOSIS — Z0000 Encounter for general adult medical examination without abnormal findings: Secondary | ICD-10-CM | POA: Diagnosis not present

## 2020-04-22 DIAGNOSIS — E669 Obesity, unspecified: Secondary | ICD-10-CM | POA: Diagnosis not present

## 2020-04-22 DIAGNOSIS — M1990 Unspecified osteoarthritis, unspecified site: Secondary | ICD-10-CM | POA: Diagnosis not present

## 2020-11-09 ENCOUNTER — Ambulatory Visit (INDEPENDENT_AMBULATORY_CARE_PROVIDER_SITE_OTHER): Payer: Medicaid Other

## 2020-11-09 ENCOUNTER — Other Ambulatory Visit (HOSPITAL_COMMUNITY)
Admission: RE | Admit: 2020-11-09 | Discharge: 2020-11-09 | Disposition: A | Discharge status: Home / Self Care | Payer: Medicaid Other | Source: Ambulatory Visit | Attending: Obstetrics | Admitting: Obstetrics

## 2020-11-09 ENCOUNTER — Other Ambulatory Visit: Payer: Self-pay

## 2020-11-09 DIAGNOSIS — N898 Other specified noninflammatory disorders of vagina: Secondary | ICD-10-CM

## 2020-11-09 MED ORDER — FLUCONAZOLE 150 MG PO TABS: 150.0000 mg | ORAL_TABLET | Freq: Once | ORAL | 0 refills | Status: AC

## 2020-11-09 NOTE — Progress Notes (Signed)
SUBJECTIVE:  35 y.o. female complains of white, thick vaginal discharge with itching for 1 day(s). Patient would like to be checked for all STDs. Denies abnormal vaginal bleeding or significant pelvic pain or fever. No UTI symptoms. Denies history of known exposure to STD.  No LMP recorded.  OBJECTIVE:  She appears well, afebrile. Urine dipstick: not done.  ASSESSMENT:  Vaginal Discharge  Vaginal Odor   PLAN:  GC, chlamydia, trichomonas, BVAG, CVAG probe sent to lab. Treatment: Diflucan sent to the pharmacy per protocol due to described sx. Any other tx needed will be determined once lab results are received ROV prn if symptoms persist or worsen.

## 2020-11-10 LAB — CERVICOVAGINAL ANCILLARY ONLY
Bacterial Vaginitis (gardnerella): POSITIVE — AB
Candida Glabrata: NEGATIVE
Candida Vaginitis: POSITIVE — AB
Chlamydia: NEGATIVE
Comment: NEGATIVE
Comment: NEGATIVE
Comment: NEGATIVE
Comment: NEGATIVE
Comment: NEGATIVE
Comment: NORMAL
Neisseria Gonorrhea: NEGATIVE
Trichomonas: NEGATIVE

## 2020-11-11 ENCOUNTER — Other Ambulatory Visit: Payer: Self-pay | Admitting: Obstetrics and Gynecology

## 2020-11-11 DIAGNOSIS — B379 Candidiasis, unspecified: Secondary | ICD-10-CM

## 2020-11-11 DIAGNOSIS — B9689 Other specified bacterial agents as the cause of diseases classified elsewhere: Secondary | ICD-10-CM

## 2020-11-11 MED ORDER — TERCONAZOLE 0.4 % VA CREA: 1.0000 | TOPICAL_CREAM | Freq: Every day | VAGINAL | 0 refills | Status: DC

## 2020-11-11 MED ORDER — METRONIDAZOLE 500 MG PO TABS: 500.0000 mg | ORAL_TABLET | Freq: Two times a day (BID) | ORAL | 0 refills | Status: DC

## 2021-03-08 ENCOUNTER — Ambulatory Visit: Payer: Medicaid Other

## 2021-03-10 ENCOUNTER — Other Ambulatory Visit: Payer: Self-pay

## 2021-03-10 ENCOUNTER — Ambulatory Visit (INDEPENDENT_AMBULATORY_CARE_PROVIDER_SITE_OTHER): Payer: Medicaid Other

## 2021-03-10 ENCOUNTER — Encounter: Payer: Self-pay | Admitting: Obstetrics

## 2021-03-10 VITALS — BP 129/79 | HR 88 | Ht 68.0 in | Wt 188.0 lb

## 2021-03-10 DIAGNOSIS — N912 Amenorrhea, unspecified: Secondary | ICD-10-CM | POA: Diagnosis not present

## 2021-03-10 DIAGNOSIS — O0990 Supervision of high risk pregnancy, unspecified, unspecified trimester: Secondary | ICD-10-CM

## 2021-03-10 DIAGNOSIS — O219 Vomiting of pregnancy, unspecified: Secondary | ICD-10-CM

## 2021-03-10 LAB — POCT URINE PREGNANCY: Preg Test, Ur: POSITIVE — AB

## 2021-03-10 MED ORDER — PROMETHAZINE HCL 25 MG PO TABS: 25.0000 mg | ORAL_TABLET | Freq: Four times a day (QID) | ORAL | 2 refills | Status: DC | PRN

## 2021-03-10 MED ORDER — PREPLUS 27-1 MG PO TABS: 1.0000 | ORAL_TABLET | Freq: Every day | ORAL | 13 refills | Status: DC

## 2021-03-10 NOTE — Progress Notes (Signed)
[name] presents today for UPT. She has no unusual complaints and complains of nausea with vomiting for several days. LMP: 01/16/21    OBJECTIVE: Appears well, in no apparent distress.  OB History     Gravida  6   Para  3   Term  3   Preterm  0   AB  2   Living  3      SAB      IAB      Ectopic      Multiple      Live Births  3          Home UPT Result: Positive In-Office UPT result:Positive I have reviewed the patient's medical, obstetrical, social, and family histories, and medications.   ASSESSMENT: Positive pregnancy test  PLAN Prenatal care to be completed at: [facility].  Pt was given Rx for PNV and phenergan. Appointment scheduled for NOB and ultrasound confirmation.

## 2021-03-15 ENCOUNTER — Emergency Department (HOSPITAL_COMMUNITY)
Admission: EM | Admit: 2021-03-15 | Discharge: 2021-03-15 | Disposition: A | Discharge status: Home / Self Care | Payer: Medicaid Other | Attending: Emergency Medicine | Admitting: Emergency Medicine

## 2021-03-15 ENCOUNTER — Other Ambulatory Visit: Payer: Self-pay

## 2021-03-15 DIAGNOSIS — M25511 Pain in right shoulder: Secondary | ICD-10-CM | POA: Insufficient documentation

## 2021-03-15 DIAGNOSIS — R000 Tachycardia, unspecified: Secondary | ICD-10-CM | POA: Insufficient documentation

## 2021-03-15 DIAGNOSIS — Z3A08 8 weeks gestation of pregnancy: Secondary | ICD-10-CM | POA: Diagnosis not present

## 2021-03-15 DIAGNOSIS — R109 Unspecified abdominal pain: Secondary | ICD-10-CM | POA: Insufficient documentation

## 2021-03-15 DIAGNOSIS — O26891 Other specified pregnancy related conditions, first trimester: Secondary | ICD-10-CM | POA: Diagnosis present

## 2021-03-15 DIAGNOSIS — W000XXA Fall on same level due to ice and snow, initial encounter: Secondary | ICD-10-CM | POA: Insufficient documentation

## 2021-03-15 DIAGNOSIS — W19XXXA Unspecified fall, initial encounter: Secondary | ICD-10-CM

## 2021-03-15 NOTE — ED Provider Notes (Signed)
[facility] EMERGENCY DEPARTMENT Provider Note   CSN: [id number] Arrival date & time: 03/15/21  1719     History Chief Complaint  Patient presents with   Fall   Flank Pain    Latasha Carlson is a 35 y.o. female with past medical history significant for the fact that she is around 4 to [redacted] weeks pregnant.  Her last menstrual period was October 29, however she reports that she had intercourse on November 18, so she estimates that she is around 4 to [redacted] weeks pregnant.  Patient has not had a confirmatory ultrasound at this time.  Patient reports that she slipped and fell on the ice prior to arrival, and has pain on her right shoulder, and right flank.  Patient denies abdominal pain, or vaginal bleeding at this time.  Patient has not taken anything for pain prior to arrival.  Patient rates her pain 10/10.   Fall  Flank Pain      No past medical history on file.  Patient Active Problem List   Diagnosis Date Noted   Primary osteoarthritis of right knee 04/02/2018    Past Surgical History:  Procedure Laterality Date   KNEE ARTHROSCOPY WITH MEDIAL MENISECTOMY Right      OB History     Gravida  6   Para  3   Term  3   Preterm  0   AB  2   Living  3      SAB      IAB      Ectopic      Multiple      Live Births  3           Family History  Problem Relation Age of Onset   Stroke Father     Social History   Tobacco Use   Smoking status: Never   Smokeless tobacco: Never  Vaping Use   Vaping Use: Never used  Substance Use Topics   Alcohol use: Not Currently   Drug use: Not Currently    Home Medications Prior to Admission medications   Medication Sig Start Date End Date Taking? Authorizing Provider  Prenatal Vit-Fe Fumarate-FA (PREPLUS) 27-1 MG TABS Take 1 tablet by mouth daily. 03/10/21   Hermina Staggers, MD  promethazine (PHENERGAN) 25 MG tablet Take 1 tablet (25 mg total) by mouth every 6 (six) hours as needed for nausea or  vomiting. 03/10/21   Hermina Staggers, MD    Allergies    Patient has no known allergies.  Review of Systems   Review of Systems  Genitourinary:  Positive for flank pain.  Musculoskeletal:  Positive for myalgias.  All other systems reviewed and are negative.  Physical Exam Updated Vital Signs BP 134/79 (BP Location: Right Arm)    Pulse (!) 115    Temp 99.7 F (37.6 C) (Oral)    Resp 17    Ht 5\' 8"  (1.727 m)    Wt 85.3 kg    LMP 01/16/2021    SpO2 100%    BMI 28.59 kg/m   Physical Exam Vitals and nursing note reviewed.  Constitutional:      General: She is not in acute distress.    Appearance: Normal appearance.  HENT:     Head: Normocephalic and atraumatic.  Eyes:     General:        Right eye: No discharge.        Left eye: No discharge.  Cardiovascular:  Rate and Rhythm: Regular rhythm. Tachycardia present.     Pulses: Normal pulses.     Comments:  Intact radial, ulnar pulses of the right side.  Patient with some intermittent tachycardia.  She was tachycardic on arrival, she had normal rate on my examination. Pulmonary:     Effort: Pulmonary effort is normal. No respiratory distress.  Abdominal:     Comments: Some tenderness to palpation without rebound, rigidity, guarding of the right flank.  There is no ecchymosis present.  I cannot palpate the uterine fundus as patient is still so early in her pregnancy.  Musculoskeletal:        General: Tenderness present. No deformity.     Comments: Patient with some tenderness without step-off or deformity of the right shoulder, and right flank.  She has intact strength 5 out of 5 to flexion, extension, abduction, adduction, internal, external rotation of the right shoulder.   Skin:    General: Skin is warm and dry.  Neurological:     Mental Status: She is alert and oriented to person, place, and time.  Psychiatric:        Mood and Affect: Mood normal.        Behavior: Behavior normal.    ED Results / Procedures /  Treatments   Labs (all labs ordered are listed, but only abnormal results are displayed) Labs Reviewed - No data to display  EKG None  Radiology No results found.  Procedures Procedures   Medications Ordered in ED Medications - No data to display  ED Course  I have reviewed the triage vital signs and the nursing notes.  Pertinent labs & imaging results that were available during my care of the patient were reviewed by me and considered in my medical decision making (see chart for details).    MDM Rules/Calculators/A&P                         I discussed this case with my attending physician who cosigned this note including patient's presenting symptoms, physical exam, and planned diagnostics and interventions. Attending physician stated agreement with plan or made changes to plan which were implemented.   Overall well appearing female who presents after a slip and fall on the ice just PTA. Patient reports she is 4-[redacted] weeks pregnant. She was tachycardic on arrival, had normal pulse on my exam. She does have a borderline fever, but no cough, sore throat, chills.  She has TTP right shoulder without step off or deformity noted. Minimal ttp flank. No ecchymosis noted.  Discussed with patient at this early stage of pregnancy an ultrasound may not show any evidence of IUP, and will not give [place] much information on her recent fall. Discussed performing for comfort but do not strongly recommend at this time. Patient opts to go home with return precautions. Strict return precautions for worsening abdominal pain, back pain, vaginal bleeding. Recommend tylenol and rest for the pain.  Final Clinical Impression(s) / ED Diagnoses Final diagnoses:  Fall, initial encounter  Right flank pain    Rx / DC Orders ED Discharge Orders     None        [place] 03/15/21 Berline Lopes, MD 03/15/21 2318

## 2021-03-15 NOTE — Discharge Instructions (Signed)
As we discussed with how early along in your pregnancy you are would not recommend any evaluation as it is difficult to see this at this time, and there would be no intervention if there were to be an issue.  If you have worsening abdominal pain, back pain, or began to develop vaginal bleeding you may return to the [facility] which is one of our sister facilities that specializes in pregnant patients for further evaluation.  In the meantime I recommend plenty of rest, Tylenol for pain, and monitoring yourself for worsening of symptoms.  If you have any questions, concerns, or the pain worsens please return for further evaluation.

## 2021-03-15 NOTE — ED Triage Notes (Signed)
Pt reports she slipped on some ice and fell on her right side. Pt reports right side flank pain. Pt also states that she is about [redacted] weeks pregnant.

## 2021-03-15 NOTE — ED Notes (Signed)
Pt discharged and ambulated out of triage without difficulty. °

## 2021-03-22 ENCOUNTER — Other Ambulatory Visit: Payer: Self-pay

## 2021-03-22 ENCOUNTER — Inpatient Hospital Stay (HOSPITAL_COMMUNITY)
Admission: AD | Admit: 2021-03-22 | Discharge: 2021-03-22 | Disposition: A | Discharge status: Home / Self Care | Payer: Medicaid Other | Attending: Obstetrics & Gynecology | Admitting: Obstetrics & Gynecology

## 2021-03-22 ENCOUNTER — Encounter (HOSPITAL_COMMUNITY): Payer: Self-pay | Admitting: Obstetrics & Gynecology

## 2021-03-22 ENCOUNTER — Inpatient Hospital Stay (HOSPITAL_COMMUNITY): Payer: Medicaid Other

## 2021-03-22 DIAGNOSIS — R109 Unspecified abdominal pain: Secondary | ICD-10-CM | POA: Insufficient documentation

## 2021-03-22 DIAGNOSIS — E876 Hypokalemia: Secondary | ICD-10-CM | POA: Diagnosis not present

## 2021-03-22 DIAGNOSIS — O99281 Endocrine, nutritional and metabolic diseases complicating pregnancy, first trimester: Secondary | ICD-10-CM | POA: Insufficient documentation

## 2021-03-22 DIAGNOSIS — E860 Dehydration: Secondary | ICD-10-CM

## 2021-03-22 DIAGNOSIS — Z3A09 9 weeks gestation of pregnancy: Secondary | ICD-10-CM | POA: Diagnosis not present

## 2021-03-22 DIAGNOSIS — M549 Dorsalgia, unspecified: Secondary | ICD-10-CM | POA: Diagnosis not present

## 2021-03-22 DIAGNOSIS — O09521 Supervision of elderly multigravida, first trimester: Secondary | ICD-10-CM | POA: Diagnosis not present

## 2021-03-22 DIAGNOSIS — O3680X0 Pregnancy with inconclusive fetal viability, not applicable or unspecified: Secondary | ICD-10-CM | POA: Diagnosis not present

## 2021-03-22 DIAGNOSIS — O208 Other hemorrhage in early pregnancy: Secondary | ICD-10-CM | POA: Diagnosis not present

## 2021-03-22 DIAGNOSIS — O26891 Other specified pregnancy related conditions, first trimester: Secondary | ICD-10-CM

## 2021-03-22 DIAGNOSIS — O219 Vomiting of pregnancy, unspecified: Secondary | ICD-10-CM | POA: Insufficient documentation

## 2021-03-22 DIAGNOSIS — O99891 Other specified diseases and conditions complicating pregnancy: Secondary | ICD-10-CM | POA: Insufficient documentation

## 2021-03-22 LAB — COMPREHENSIVE METABOLIC PANEL
ALT: 92 U/L — ABNORMAL HIGH (ref 0–44)
AST: 48 U/L — ABNORMAL HIGH (ref 15–41)
Albumin: 3.8 g/dL (ref 3.5–5.0)
Alkaline Phosphatase: 47 U/L (ref 38–126)
Anion gap: 12 (ref 5–15)
BUN: 9 mg/dL (ref 6–20)
CO2: 27 mmol/L (ref 22–32)
Calcium: 9.3 mg/dL (ref 8.9–10.3)
Chloride: 93 mmol/L — ABNORMAL LOW (ref 98–111)
Creatinine, Ser: 1.04 mg/dL — ABNORMAL HIGH (ref 0.44–1.00)
GFR, Estimated: >60 mL/min (ref >60)
Glucose, Bld: 105 mg/dL — ABNORMAL HIGH (ref 70–99)
Potassium: 2.8 mmol/L — ABNORMAL LOW (ref 3.5–5.1)
Sodium: 132 mmol/L — ABNORMAL LOW (ref 135–145)
Total Bilirubin: 1.2 mg/dL (ref 0.3–1.2)
Total Protein: 7.6 g/dL (ref 6.5–8.1)

## 2021-03-22 LAB — URINALYSIS, ROUTINE W REFLEX MICROSCOPIC
Bilirubin Urine: NEGATIVE
Cellular Cast, UA: 4
Glucose, UA: 50 mg/dL — AB
Hgb urine dipstick: NEGATIVE
Ketones, ur: 5 mg/dL — AB
Nitrite: NEGATIVE
Protein, ur: 100 mg/dL — AB
Specific Gravity, Urine: 1.024 (ref 1.005–1.030)
WBC, UA: >50 WBC/hpf — ABNORMAL HIGH (ref 0–5)
pH: 5 (ref 5.0–8.0)

## 2021-03-22 LAB — CBC WITH DIFFERENTIAL/PLATELET
Abs Immature Granulocytes: 0.01 10*3/uL (ref 0.00–0.07)
Basophils Absolute: 0 10*3/uL (ref 0.0–0.1)
Basophils Relative: 0 %
Eosinophils Absolute: 0 10*3/uL (ref 0.0–0.5)
Eosinophils Relative: 0 %
HCT: 40.4 % (ref 36.0–46.0)
Hemoglobin: 13.9 g/dL (ref 12.0–15.0)
Immature Granulocytes: 0 %
Lymphocytes Relative: 11 %
Lymphs Abs: 1 10*3/uL (ref 0.7–4.0)
MCH: 24.5 pg — ABNORMAL LOW (ref 26.0–34.0)
MCHC: 34.4 g/dL (ref 30.0–36.0)
MCV: 71.1 fL — ABNORMAL LOW (ref 80.0–100.0)
Monocytes Absolute: 0.8 10*3/uL (ref 0.1–1.0)
Monocytes Relative: 9 %
Neutro Abs: 7.1 10*3/uL (ref 1.7–7.7)
Neutrophils Relative %: 80 %
Platelets: 231 10*3/uL (ref 150–400)
RBC: 5.68 MIL/uL — ABNORMAL HIGH (ref 3.87–5.11)
RDW: 13.3 % (ref 11.5–15.5)
WBC: 8.9 10*3/uL (ref 4.0–10.5)
nRBC: 0 % (ref 0.0–0.2)

## 2021-03-22 LAB — ABO/RH: ABO/RH(D): A POS

## 2021-03-22 LAB — HCG, QUANTITATIVE, PREGNANCY: hCG, Beta Chain, Quant, S: 131942 m[IU]/mL — ABNORMAL HIGH (ref <5)

## 2021-03-22 LAB — LIPASE, BLOOD: Lipase: 25 U/L (ref 11–51)

## 2021-03-22 MED ORDER — KCL-LACTATED RINGERS 20 MEQ/L IV SOLN
INTRAVENOUS | Status: DC
  Filled 2021-03-22: qty 1000

## 2021-03-22 MED ORDER — POTASSIUM CHLORIDE 2 MEQ/ML IV SOLN
INTRAVENOUS | Status: DC
  Filled 2021-03-22 (×2): qty 1000

## 2021-03-22 MED ORDER — GLYCOPYRROLATE 1 MG PO TABS: 1.0000 mg | ORAL_TABLET | Freq: Three times a day (TID) | ORAL | 0 refills | Status: DC

## 2021-03-22 MED ORDER — GLYCOPYRROLATE 0.2 MG/ML IJ SOLN
0.2000 mg | Freq: Once | INTRAMUSCULAR | Status: AC
Start: 2021-03-22 — End: 2021-03-22
  Administered 2021-03-22: 0.2 mg via INTRAVENOUS
  Filled 2021-03-22: qty 1

## 2021-03-22 MED ORDER — BONJESTA 20-20 MG PO TBCR: 1.0000 | EXTENDED_RELEASE_TABLET | Freq: Every day | ORAL | 3 refills | Status: DC

## 2021-03-22 MED ORDER — FAMOTIDINE IN NACL 20-0.9 MG/50ML-% IV SOLN
20.0000 mg | Freq: Once | INTRAVENOUS | Status: AC
  Administered 2021-03-22: 20 mg via INTRAVENOUS
  Filled 2021-03-22: qty 50

## 2021-03-22 MED ORDER — LACTATED RINGERS IV BOLUS
1000.0000 mL | Freq: Once | INTRAVENOUS | Status: AC
  Administered 2021-03-22: 1000 mL via INTRAVENOUS

## 2021-03-22 MED ORDER — ONDANSETRON HCL 4 MG/2ML IJ SOLN
4.0000 mg | Freq: Once | INTRAMUSCULAR | Status: AC
  Administered 2021-03-22: 4 mg via INTRAVENOUS
  Filled 2021-03-22: qty 2

## 2021-03-22 NOTE — MAU Note (Signed)
Latasha Carlson is a 36 y.o. at [redacted]w[redacted]d here in MAU reporting: started vomiting 3 weeks ago and it has been getting worse over the past week. States every morning she vomits. 2 episodes of vomiting today. Also reporting left sided abdominal pain and back pain. Has not picked up phenergan Rx due to transportation issues.   LMP: 01/16/2021  Onset of complaint: ongoing  Pain score: 0/10  Vitals:   03/22/21 0943  BP: 133/90  Pulse: 97  Resp: 16  Temp: 98.8 F (37.1 C)  SpO2: 100%     Lab orders placed from triage: UA

## 2021-03-22 NOTE — MAU Provider Note (Signed)
Chief Complaint:  Nausea   Event Date/Time   First Provider Initiated Contact with Patient 03/22/21 1001     HPI: Latasha Carlson is a 36 y.o. [id number] at [redacted]w[redacted]d who presents to maternity admissions reporting severe nausea/vomiting for the past several weeks and has been unable to keep down fluids for the past few days. Now also has left-sided abdominal and back pain and is concerned she has an ectopic pregnancy. No vaginal bleeding or other physical complaints.   Pregnancy Course: Plans to receive care at [facility] where she has in the past.  History reviewed. No pertinent past medical history. OB History  Gravida Para Term Preterm AB Living  6 3 3  0 2 3  SAB IAB Ectopic Multiple Live Births  2   0   3    # Outcome Date GA Lbr Len/2nd Weight Sex Delivery Anes PTL Lv  6 Current           5 Term      Vag-Spont   LIV  4 Term      Vag-Spont   LIV  3 Term      Vag-Spont   LIV  2 SAB      SAB     1 SAB      SAB      Past Surgical History:  Procedure Laterality Date   DILATION AND CURETTAGE OF UTERUS     KNEE ARTHROSCOPY WITH MEDIAL MENISECTOMY Right    Family History  Problem Relation Age of Onset   Stroke Father    Social History   Tobacco Use   Smoking status: Never   Smokeless tobacco: Never  Vaping Use   Vaping Use: Never used  Substance Use Topics   Alcohol use: Not Currently   Drug use: Not Currently   No Known Allergies No medications prior to admission.   I have reviewed patient's Past Medical Hx, Surgical Hx, Family Hx, Social Hx, medications and allergies.   ROS:  Review of Systems  Constitutional:  Positive for fatigue. Negative for chills and fever.  HENT:  Negative for congestion and sore throat.   Eyes:  Negative for visual disturbance.  Respiratory:  Negative for cough and shortness of breath.   Cardiovascular:  Positive for chest pain (burning in upper chest after vomiting).  Gastrointestinal:  Positive for abdominal pain (epigastric with vomiting but  also lower left abdomen), nausea and vomiting.  Genitourinary:  Negative for difficulty urinating, dysuria, flank pain and vaginal bleeding.  Musculoskeletal:  Positive for back pain (lower left side).  Neurological:  Positive for light-headedness. Negative for dizziness, seizures, syncope and headaches.   Physical Exam  Patient Vitals for the past 24 hrs:  BP Temp Temp src Pulse Resp SpO2  03/22/21 1607 129/77 -- -- 81 -- --  03/22/21 1336 121/65 98.2 F (36.8 C) Oral 79 16 100 %  03/22/21 0943 133/90 98.8 F (37.1 C) Oral 97 16 100 %   Physical Exam Vitals and nursing note reviewed.  Constitutional:      General: She is not in acute distress.    Appearance: Normal appearance. She is ill-appearing (appears gaunt, 20# weight loss).  HENT:     Head: Normocephalic and atraumatic.     Mouth/Throat:     Mouth: Mucous membranes are dry.  Eyes:     Pupils: Pupils are equal, round, and reactive to light.  Cardiovascular:     Rate and Rhythm: Normal rate and regular rhythm.     Pulses:  Normal pulses.  Pulmonary:     Effort: Pulmonary effort is normal.  Abdominal:     Palpations: Abdomen is soft.     Tenderness: There is no abdominal tenderness.  Genitourinary:    Comments: Sent to U/S Musculoskeletal:        General: Normal range of motion.  Skin:    General: Skin is warm and dry.     Capillary Refill: Capillary refill takes less than 2 seconds.  Neurological:     Mental Status: She is alert and oriented to person, place, and time.  Psychiatric:        Mood and Affect: Mood normal.        Behavior: Behavior normal.        Thought Content: Thought content normal.        Judgment: Judgment normal.    Labs: Results for orders placed or performed during the hospital encounter of 03/22/21 (from the past 24 hour(s))  Urinalysis, Routine w reflex microscopic Urine, Clean Catch     Status: Abnormal   Collection Time: 03/22/21  9:54 AM  Result Value Ref Range   Color, Urine AMBER  (A) YELLOW   APPearance CLOUDY (A) CLEAR   Specific Gravity, Urine 1.024 1.005 - 1.030   pH 5.0 5.0 - 8.0   Glucose, UA 50 (A) NEGATIVE mg/dL   Hgb urine dipstick NEGATIVE NEGATIVE   Bilirubin Urine NEGATIVE NEGATIVE   Ketones, ur 5 (A) NEGATIVE mg/dL   Protein, ur 100 (A) NEGATIVE mg/dL   Nitrite NEGATIVE NEGATIVE   Leukocytes,Ua TRACE (A) NEGATIVE   RBC / HPF 0-5 0 - 5 RBC/hpf   WBC, UA >50 (H) 0 - 5 WBC/hpf   Bacteria, UA RARE (A) NONE SEEN   Squamous Epithelial / LPF 11-20 0 - 5   WBC Clumps PRESENT    Mucus PRESENT    Hyaline Casts, UA PRESENT    Granular Casts, UA PRESENT    Cellular Cast, UA 4   CBC with Differential/Platelet     Status: Abnormal   Collection Time: 03/22/21 10:30 AM  Result Value Ref Range   WBC 8.9 4.0 - 10.5 K/uL   RBC 5.68 (H) 3.87 - 5.11 MIL/uL   Hemoglobin 13.9 12.0 - 15.0 g/dL   HCT 40.4 36.0 - 46.0 %   MCV 71.1 (L) 80.0 - 100.0 fL   MCH 24.5 (L) 26.0 - 34.0 pg   MCHC 34.4 30.0 - 36.0 g/dL   RDW 13.3 11.5 - 15.5 %   Platelets 231 150 - 400 K/uL   nRBC 0.0 0.0 - 0.2 %   Neutrophils Relative % 80 %   Neutro Abs 7.1 1.7 - 7.7 K/uL   Lymphocytes Relative 11 %   Lymphs Abs 1.0 0.7 - 4.0 K/uL   Monocytes Relative 9 %   Monocytes Absolute 0.8 0.1 - 1.0 K/uL   Eosinophils Relative 0 %   Eosinophils Absolute 0.0 0.0 - 0.5 K/uL   Basophils Relative 0 %   Basophils Absolute 0.0 0.0 - 0.1 K/uL   Immature Granulocytes 0 %   Abs Immature Granulocytes 0.01 0.00 - 0.07 K/uL  Comprehensive metabolic panel     Status: Abnormal   Collection Time: 03/22/21 10:30 AM  Result Value Ref Range   Sodium 132 (L) 135 - 145 mmol/L   Potassium 2.8 (L) 3.5 - 5.1 mmol/L   Chloride 93 (L) 98 - 111 mmol/L   CO2 27 22 - 32 mmol/L   Glucose, Bld 105 (H)  70 - 99 mg/dL   BUN 9 6 - 20 mg/dL   Creatinine, Ser 1.04 (H) 0.44 - 1.00 mg/dL   Calcium 9.3 8.9 - 10.3 mg/dL   Total Protein 7.6 6.5 - 8.1 g/dL   Albumin 3.8 3.5 - 5.0 g/dL   AST 48 (H) 15 - 41 U/L   ALT 92 (H) 0  - 44 U/L   Alkaline Phosphatase 47 38 - 126 U/L   Total Bilirubin 1.2 0.3 - 1.2 mg/dL   GFR, Estimated >60 >60 mL/min   Anion gap 12 5 - 15  Lipase, blood     Status: None   Collection Time: 03/22/21 10:30 AM  Result Value Ref Range   Lipase 25 11 - 51 U/L  hCG, quantitative, pregnancy     Status: Abnormal   Collection Time: 03/22/21 10:30 AM  Result Value Ref Range   hCG, Beta Chain, Quant, S 131,942 (H) <5 mIU/mL  ABO/Rh     Status: None   Collection Time: 03/22/21 10:30 AM  Result Value Ref Range   ABO/RH(D) A POS    No rh immune globuloin      NOT A RH IMMUNE GLOBULIN CANDIDATE, PT RH POSITIVE Performed at [facility], [place] [address], [place], [place] [address]    Imaging:  US OB LESS THAN 14 WEEKS WITH OB TRANSVAGINAL  Result Date: 03/22/2021 CLINICAL DATA:  Left-sided abdominal and back pain. Pregnant patient, 9 weeks and 2 days based on her last menstrual period. EXAM: OBSTETRIC <14 WK [place] AND TRANSVAGINAL OB US TECHNIQUE: Both transabdominal and transvaginal ultrasound examinations were performed for complete evaluation of the gestation as well as the maternal uterus, adnexal regions, and pelvic cul-de-sac. Transvaginal technique was performed to assess early pregnancy. COMPARISON:  None. FINDINGS: Intrauterine gestational sac: Single Yolk sac:  Visualized. Embryo:  Not Visualized. Cardiac Activity: Not Visualized. MSD: 24.2 mm   7 w   3 d Subchorionic hemorrhage:  Small subchronic hemorrhage. Maternal uterus/adnexae: 2 mural fibroids, 3.2 cm x 3 cm x 3.9 cm fundal fibroid and a 1.6 x 1.8 x 2.1 cm mid uterine fibroid. Normal ovaries and adnexa. No free fluid. IMPRESSION: 1. Well-formed intrauterine gestational sac and yolk sac, but no evidence of an embryo. This may be due to the early stage of pregnancy. However, given the size of the gestational sac, this is suspicious for a nonviable pregnancy and warrants short-term follow-up and correlation with the beta HCG level.  Recommend follow-up ultrasound in 7-10 days. 2. Small subchronic hemorrhage. Two uterine mural fibroids as described. No other abnormalities. Electronically Signed   By: Lajean Manes M.D.   On: 03/22/2021 11:17    MAU Course: Orders Placed This Encounter  Procedures   US OB LESS THAN 14 WEEKS WITH OB TRANSVAGINAL   US OB Transvaginal   Urinalysis, Routine w reflex microscopic Urine, Clean Catch   CBC with Differential/Platelet   Comprehensive metabolic panel   Lipase, blood   hCG, quantitative, pregnancy   ABO/Rh   Discharge patient   Meds ordered this encounter  Medications   lactated ringers bolus 1,000 mL   ondansetron (ZOFRAN) injection 4 mg   famotidine (PEPCID) IVPB 20 mg premix   glycopyrrolate (ROBINUL) injection 0.2 mg   DISCONTD: lactated ringers with KCl 20 mEq/L infusion   DISCONTD: lactated ringers 1,000 mL with potassium chloride 20 mEq infusion   Doxylamine-Pyridoxine ER (BONJESTA) 20-20 MG TBCR    Sig: Take 1 tablet by mouth at bedtime.  Dispense:  60 tablet    Refill:  3    Order Specific Question:   Supervising Provider    Answer:   Donnamae Jude [[id number]]   glycopyrrolate (ROBINUL) 1 MG tablet    Sig: Take 1 tablet (1 mg total) by mouth 3 (three) times daily.    Dispense:  90 tablet    Refill:  0    Order Specific Question:   Supervising Provider    Answer:   Merrily Pew   MDM: Labs showed dehydration, elevated LFTs and hypokalemia likely due to weeks of undernourishment and vomiting. Initially gave a LR bolus with zofran and pepcid IVPB which relieved nausea but revealed hypersalivation, so gave a dose of robinul which helped and pt able to tolerate po challenge with watered down cranberry juice. By then CMP resulted, so gave another bag of LR with 34mEq of KCl. Pt expressed feeling much better.  Discussed U/S with Dr. Nelda Marseille who agreed with repeat scan in one week and careful counseling about increased likelihood of miscarriage. Counseled pt about  results and possibility of blighted ovum/missed pregnancy, sent message to office re scheduling needs and put in future scan order.   Assessment: 1. Nausea and vomiting during pregnancy   2. Abdominal pain during pregnancy in first trimester   3. Hypokalemia due to loss of potassium   4. Dehydration   5. Pregnancy with inconclusive fetal viability, single or unspecified fetus   6. [redacted] weeks gestation of pregnancy    Plan: Discharge home in stable condition with hyperemesis/bleeding precautions   Follow-up Information     [facility] Follow up.   Specialty: Radiology Why: The office will call you with your appointment, it will also load into your MyChart Contact information: [address] [place] [phone]                Allergies as of 03/22/2021   No Known Allergies      Medication List     TAKE these medications    Bonjesta 20-20 MG Tbcr Generic drug: Doxylamine-Pyridoxine ER Take 1 tablet by mouth at bedtime.   glycopyrrolate 1 MG tablet Commonly known as: Robinul Take 1 tablet (1 mg total) by mouth 3 (three) times daily.   PrePLUS 27-1 MG Tabs Take 1 tablet by mouth daily.   promethazine 25 MG tablet Commonly known as: PHENERGAN Take 1 tablet (25 mg total) by mouth every 6 (six) hours as needed for nausea or vomiting.       Gaylan Gerold, CNM, MSN, [place] Certified Nurse Midwife, [facility]

## 2021-03-31 ENCOUNTER — Encounter: Payer: Self-pay | Admitting: Obstetrics and Gynecology

## 2021-03-31 ENCOUNTER — Telehealth: Payer: Self-pay

## 2021-03-31 ENCOUNTER — Ambulatory Visit (INDEPENDENT_AMBULATORY_CARE_PROVIDER_SITE_OTHER): Payer: Medicaid Other | Admitting: Obstetrics and Gynecology

## 2021-03-31 ENCOUNTER — Other Ambulatory Visit: Payer: Self-pay

## 2021-03-31 ENCOUNTER — Ambulatory Visit
Admission: RE | Admit: 2021-03-31 | Discharge: 2021-03-31 | Disposition: A | Discharge status: Home / Self Care | Payer: Medicaid Other | Source: Ambulatory Visit | Attending: Certified Nurse Midwife | Admitting: Certified Nurse Midwife

## 2021-03-31 VITALS — BP 125/78 | HR 91 | Wt 177.0 lb

## 2021-03-31 DIAGNOSIS — O3680X0 Pregnancy with inconclusive fetal viability, not applicable or unspecified: Secondary | ICD-10-CM | POA: Diagnosis present

## 2021-03-31 DIAGNOSIS — O021 Missed abortion: Secondary | ICD-10-CM

## 2021-03-31 DIAGNOSIS — Z3A00 Weeks of gestation of pregnancy not specified: Secondary | ICD-10-CM | POA: Diagnosis not present

## 2021-03-31 DIAGNOSIS — O09519 Supervision of elderly primigravida, unspecified trimester: Secondary | ICD-10-CM | POA: Diagnosis not present

## 2021-03-31 NOTE — Telephone Encounter (Signed)
Called patient, surgery date time and preop instructions given.

## 2021-03-31 NOTE — Progress Notes (Signed)
GYNECOLOGY OFFICE VISIT NOTE  History:   Latasha Carlson is a 36 y.o. [phone] here today for follow up US from prior US on 1/2 which showed yolk sac but no embryo. She has had no vaginal bleeding.  Her f/u US today shows no embryo c/w anembryonic demise.   She is Rh positive.  This was a desired pregnancy.  She denies any abnormal vaginal discharge, bleeding, pelvic pain or other concerns.     No past medical history on file.  Past Surgical History:  Procedure Laterality Date   DILATION AND CURETTAGE OF UTERUS     KNEE ARTHROSCOPY WITH MEDIAL MENISECTOMY Right     The following portions of the patient's history were reviewed and updated as appropriate: allergies, current medications, past family history, past medical history, past social history, past surgical history and problem list.   Health Maintenance:   Diagnosis  Date Value Ref Range Status  12/09/2019   Final   - Negative for intraepithelial lesion or malignancy (NILM)     Review of Systems:  Pertinent items noted in HPI and remainder of comprehensive ROS otherwise negative.  Physical Exam:  BP 125/78    Pulse 91    Wt 177 lb (80.3 kg)    LMP 01/16/2021    BMI 26.91 kg/m  CONSTITUTIONAL: Well-developed, well-nourished female in no acute distress.  HEENT:  Normocephalic, atraumatic. External right and left ear normal. No scleral icterus.  NECK: Normal range of motion, supple, no masses noted on observation SKIN: No rash noted. Not diaphoretic. No erythema. No pallor. MUSCULOSKELETAL: Normal range of motion. No edema noted. NEUROLOGIC: Alert and oriented to person, place, and time. Normal muscle tone coordination. No cranial nerve deficit noted. PSYCHIATRIC: Normal mood and affect. Normal behavior. Normal judgment and thought content.  CARDIOVASCULAR: Normal heart rate noted RESPIRATORY: Effort and breath sounds normal, no problems with respiration noted ABDOMEN: No masses noted. No other overt distention noted.     PELVIC: Deferred  Labs and Imaging No results found for this or any previous visit (from the past 168 hour(s)). US OB Transvaginal  Result Date: 03/31/2021 CLINICAL DATA:  Follow-up pregnancy. EXAM: OBSTETRIC <14 WK [place] AND TRANSVAGINAL OB US TECHNIQUE: Both transabdominal and transvaginal ultrasound examinations were performed for complete evaluation of the gestation as well as the maternal uterus, adnexal regions, and pelvic cul-de-sac. Transvaginal technique was performed to assess early pregnancy. COMPARISON:  03/22/2021 FINDINGS: Intrauterine gestational sac: Present Yolk sac:  Large yolk sac is present. Embryo:  None Cardiac Activity: N/A Heart Rate: N/A bpm MSD: 25.83 mm   7 w   4 d Subchorionic hemorrhage:  Small. Maternal uterus/adnexae: The right ovary is normal. The left ovary is not visualized. A 4.4 x 4.0 x 4.6 cm fibroid is noted. No free pelvic fluid. IMPRESSION: Ultrasound findings consistent with anembryonic pregnancy (blighted ovum) based on mean sac diameter greater than 25 mm and no embryo demonstrated. Electronically Signed   By: Rudie Meyer M.D.   On: 03/31/2021 10:34   US OB LESS THAN 14 WEEKS WITH OB TRANSVAGINAL  Result Date: 03/22/2021 CLINICAL DATA:  Left-sided abdominal and back pain. Pregnant patient, 9 weeks and 2 days based on her last menstrual period. EXAM: OBSTETRIC <14 WK [place] AND TRANSVAGINAL OB US TECHNIQUE: Both transabdominal and transvaginal ultrasound examinations were performed for complete evaluation of the gestation as well as the maternal uterus, adnexal regions, and pelvic cul-de-sac. Transvaginal technique was performed to assess early pregnancy. COMPARISON:  None. FINDINGS: Intrauterine  gestational sac: Single Yolk sac:  Visualized. Embryo:  Not Visualized. Cardiac Activity: Not Visualized. MSD: 24.2 mm   7 w   3 d Subchorionic hemorrhage:  Small subchronic hemorrhage. Maternal uterus/adnexae: 2 mural fibroids, 3.2 cm x 3 cm x 3.9 cm fundal fibroid and a  1.6 x 1.8 x 2.1 cm mid uterine fibroid. Normal ovaries and adnexa. No free fluid. IMPRESSION: 1. Well-formed intrauterine gestational sac and yolk sac, but no evidence of an embryo. This may be due to the early stage of pregnancy. However, given the size of the gestational sac, this is suspicious for a nonviable pregnancy and warrants short-term follow-up and correlation with the beta HCG level. Recommend follow-up ultrasound in 7-10 days. 2. Small subchronic hemorrhage. Two uterine mural fibroids as described. No other abnormalities. Electronically Signed   By: Amie Portland M.D.   On: 03/22/2021 11:17    Assessment and Plan:    1. Missed abortion - Discussed definitive nature given above findings and over the appropriate interval of 9 days.  - Discussed options: expectant management, misoprostol, MVA (today) and D&E. Discussed the risks and benefits to each. - We discussed dosing and process of miso: Discussed normal bleeding and cramping with the medication. Discussed pain medication often times needed when medically induced for missed abortion.  - Risks of surgery include but are not limited to: bleeding, infection, injury to surrounding organs/tissues (i.e. bowel/bladder/ureters), need for additional procedures, wound complications, hospital re-admission, and conversion to open surgery - We discussed postop restrictions, precautions and expectations. We discussed typical hospital course and stay.  - She would like: Surgical management with D&E.  - She is Rh positive. Rhogam not indiciated.  - Message sent for surgery scheduling.   Routine preventative health maintenance measures emphasized. Please refer to After Visit Summary for other counseling recommendations.   No follow-ups on file.  Milas Hock, MD, FACOG Obstetrician & Gynecologist, [facility], [facility]

## 2021-03-31 NOTE — H&P (View-Only) (Signed)
° °GYNECOLOGY OFFICE VISIT NOTE ° °History:  ° Latasha Carlson is a 36 y.o. [id number] here today for follow up US from prior US on 1/2 which showed yolk sac but no embryo. She has had no vaginal bleeding.  Her f/u US today shows no embryo c/w anembryonic demise.  ° °She is Rh positive. ° °This was a desired pregnancy. ° °She denies any abnormal vaginal discharge, bleeding, pelvic pain or other concerns. ° ° °  °No past medical history on file. ° °Past Surgical History:  °Procedure Laterality Date  ° DILATION AND CURETTAGE OF UTERUS    ° KNEE ARTHROSCOPY WITH MEDIAL MENISECTOMY Right   ° ° °The following portions of the patient's history were reviewed and updated as appropriate: allergies, current medications, past family history, past medical history, past social history, past surgical history and problem list.  ° °Health Maintenance:   °Diagnosis  °Date Value Ref Range Status  °12/09/2019   Final  ° - Negative for intraepithelial lesion or malignancy (NILM)  °  ° °Review of Systems:  °Pertinent items noted in HPI and remainder of comprehensive ROS otherwise negative. ° °Physical Exam:  °BP 125/78    Pulse 91    Wt 177 lb (80.3 kg)    LMP 01/16/2021    BMI 26.91 kg/m²  °CONSTITUTIONAL: Well-developed, well-nourished female in no acute distress.  °HEENT:  Normocephalic, atraumatic. External right and left ear normal. No scleral icterus.  °NECK: Normal range of motion, supple, no masses noted on observation °SKIN: No rash noted. Not diaphoretic. No erythema. No pallor. °MUSCULOSKELETAL: Normal range of motion. No edema noted. °NEUROLOGIC: Alert and oriented to person, place, and time. Normal muscle tone coordination. No cranial nerve deficit noted. °PSYCHIATRIC: Normal mood and affect. Normal behavior. Normal judgment and thought content. ° °CARDIOVASCULAR: Normal heart rate noted °RESPIRATORY: Effort and breath sounds normal, no problems with respiration noted °ABDOMEN: No masses noted. No other overt distention noted.    ° °PELVIC: Deferred ° °Labs and Imaging °No results found for this or any previous visit (from the past 168 hour(s)). °US OB Transvaginal ° °Result Date: 03/31/2021 °CLINICAL DATA:  Follow-up pregnancy. EXAM: OBSTETRIC <14 WK US AND TRANSVAGINAL OB US TECHNIQUE: Both transabdominal and transvaginal ultrasound examinations were performed for complete evaluation of the gestation as well as the maternal uterus, adnexal regions, and pelvic cul-de-sac. Transvaginal technique was performed to assess early pregnancy. COMPARISON:  03/22/2021 FINDINGS: Intrauterine gestational sac: Present Yolk sac:  Large yolk sac is present. Embryo:  None Cardiac Activity: N/A Heart Rate: N/A bpm MSD: 25.83 mm   7 w   4 d Subchorionic hemorrhage:  Small. Maternal uterus/adnexae: The right ovary is normal. The left ovary is not visualized. A 4.4 x 4.0 x 4.6 cm fibroid is noted. No free pelvic fluid. IMPRESSION: Ultrasound findings consistent with anembryonic pregnancy (blighted ovum) based on mean sac diameter greater than 25 mm and no embryo demonstrated. Electronically Signed   By: P.  Gallerani M.D.   On: 03/31/2021 10:34  ° °US OB LESS THAN 14 WEEKS WITH OB TRANSVAGINAL ° °Result Date: 03/22/2021 °CLINICAL DATA:  Left-sided abdominal and back pain. Pregnant patient, 9 weeks and 2 days based on her last menstrual period. EXAM: OBSTETRIC <14 WK US AND TRANSVAGINAL OB US TECHNIQUE: Both transabdominal and transvaginal ultrasound examinations were performed for complete evaluation of the gestation as well as the maternal uterus, adnexal regions, and pelvic cul-de-sac. Transvaginal technique was performed to assess early pregnancy. COMPARISON:  None. FINDINGS: Intrauterine   gestational sac: Single Yolk sac:  Visualized. Embryo:  Not Visualized. Cardiac Activity: Not Visualized. MSD: 24.2 mm   7 w   3 d Subchorionic hemorrhage:  Small subchronic hemorrhage. Maternal uterus/adnexae: 2 mural fibroids, 3.2 cm x 3 cm x 3.9 cm fundal fibroid and a  1.6 x 1.8 x 2.1 cm mid uterine fibroid. Normal ovaries and adnexa. No free fluid. IMPRESSION: 1. Well-formed intrauterine gestational sac and yolk sac, but no evidence of an embryo. This may be due to the early stage of pregnancy. However, given the size of the gestational sac, this is suspicious for a nonviable pregnancy and warrants short-term follow-up and correlation with the beta HCG level. Recommend follow-up ultrasound in 7-10 days. 2. Small subchronic hemorrhage. Two uterine mural fibroids as described. No other abnormalities. Electronically Signed   By: Amie Portland M.D.   On: 03/22/2021 11:17    Assessment and Plan:    1. Missed abortion - Discussed definitive nature given above findings and over the appropriate interval of 9 days.  - Discussed options: expectant management, misoprostol, MVA (today) and D&E. Discussed the risks and benefits to each. - We discussed dosing and process of miso: Discussed normal bleeding and cramping with the medication. Discussed pain medication often times needed when medically induced for missed abortion.  - Risks of surgery include but are not limited to: bleeding, infection, injury to surrounding organs/tissues (i.e. bowel/bladder/ureters), need for additional procedures, wound complications, hospital re-admission, and conversion to open surgery - We discussed postop restrictions, precautions and expectations. We discussed typical hospital course and stay.  - She would like: Surgical management with D&E.  - She is Rh positive. Rhogam not indiciated.  - Message sent for surgery scheduling.   Routine preventative health maintenance measures emphasized. Please refer to After Visit Summary for other counseling recommendations.   No follow-ups on file.  Milas Hock, MD, FACOG Obstetrician & Gynecologist, [facility], [facility]

## 2021-04-01 ENCOUNTER — Other Ambulatory Visit: Payer: Self-pay

## 2021-04-01 ENCOUNTER — Encounter (HOSPITAL_COMMUNITY): Payer: Self-pay | Admitting: Obstetrics & Gynecology

## 2021-04-01 ENCOUNTER — Other Ambulatory Visit: Payer: Self-pay | Admitting: Obstetrics and Gynecology

## 2021-04-01 DIAGNOSIS — O021 Missed abortion: Secondary | ICD-10-CM

## 2021-04-01 NOTE — Anesthesia Preprocedure Evaluation (Addendum)
Anesthesia Evaluation  Patient identified by MRN, date of birth, ID band Patient awake    Reviewed: Allergy & Precautions, NPO status , Patient's Chart, lab work & pertinent test results  Airway Mallampati: II  TM Distance: >3 FB Neck ROM: Full    Dental no notable dental hx.    Pulmonary neg pulmonary ROS,    Pulmonary exam normal breath sounds clear to auscultation       Cardiovascular Exercise Tolerance: Good negative cardio ROS Normal cardiovascular exam Rhythm:Regular Rate:Normal     Neuro/Psych negative neurological ROS  negative psych ROS   GI/Hepatic negative GI ROS, Neg liver ROS,   Endo/Other  negative endocrine ROS  Renal/GU negative Renal ROS  negative genitourinary   Musculoskeletal  (+) Arthritis , Osteoarthritis,    Abdominal   Peds negative pediatric ROS (+)  Hematology negative hematology ROS (+) hct 40.4, plt 231   Anesthesia Other Findings   Reproductive/Obstetrics Missed ab 9 weeks                            Anesthesia Physical Anesthesia Plan  ASA: 1  Anesthesia Plan: General   Post-op Pain Management: Tylenol PO (pre-op) and Toradol IV (intra-op)   Induction: Intravenous  PONV Risk Score and Plan: 4 or greater and Midazolam, Ondansetron, Dexamethasone, Scopolamine patch - Pre-op and Treatment may vary due to age or medical condition  Airway Management Planned: LMA  Additional Equipment: None  Intra-op Plan:   Post-operative Plan: Extubation in OR  Informed Consent: I have reviewed the patients History and Physical, chart, labs and discussed the procedure including the risks, benefits and alternatives for the proposed anesthesia with the patient or authorized representative who has indicated his/her understanding and acceptance.     Dental advisory given  Plan Discussed with: CRNA, Anesthesiologist and Surgeon  Anesthesia Plan Comments:         Anesthesia Quick Evaluation

## 2021-04-01 NOTE — Progress Notes (Addendum)
Ms Deale denies chest pain or shortness of breath or chest pain. Patient denies having any s/s of Covid in her household.  Patient denies any known exposure to Covid.   PCP is with [facility].  On 03/22/21, Ms Mynatt was seen in the ob hospital, labs were drawn, Na was 132, ALt was 92. I sent this information to anesthesia PA-C, Myra Gianotti stated that patient needs a Na and to check with tomorrow's anesthesiologist to see if a repeat on CMP or Liver function should be done.  I put a high lighted note on the front of the chart.chart.  I instructed Ms Anzelone to shower with antibiotic soap, if it is available.  Dry off with a clean towel. Do not put lotion, powder, cologne or deodorant or makeup.No jewelry or piercings. Men may shave their face and neck. Woman should not shave. No nail polish, artificial or acrylic nails. Wear clean clothes, brush your teeth. Glasses, contact lens,dentures or partials may not be worn in the OR. If you need to wear them, please bring a case for glasses, do not wear contacts or bring a case, the hospital does not have contact cases, dentures or partials will have to be removed , make sure they are clean, we will provide a denture cup to put them in. You will need some one to drive you home and a responsible person over the age of 51 to stay with you for the first 24 hours after surgery.  Ms Devane will work on getting someone to stay with her.

## 2021-04-02 ENCOUNTER — Encounter (HOSPITAL_COMMUNITY)
Admission: RE | Disposition: A | Discharge status: Home / Self Care | Payer: Self-pay | Source: Home / Self Care | Attending: Obstetrics & Gynecology

## 2021-04-02 ENCOUNTER — Ambulatory Visit (HOSPITAL_COMMUNITY): Payer: Medicaid Other | Admitting: Vascular Surgery

## 2021-04-02 ENCOUNTER — Ambulatory Visit (HOSPITAL_COMMUNITY)
Admission: RE | Admit: 2021-04-02 | Discharge: 2021-04-02 | Disposition: A | Discharge status: Home / Self Care | Payer: Medicaid Other | Attending: Obstetrics & Gynecology | Admitting: Obstetrics & Gynecology

## 2021-04-02 DIAGNOSIS — M1990 Unspecified osteoarthritis, unspecified site: Secondary | ICD-10-CM | POA: Diagnosis not present

## 2021-04-02 DIAGNOSIS — O26891 Other specified pregnancy related conditions, first trimester: Secondary | ICD-10-CM | POA: Insufficient documentation

## 2021-04-02 DIAGNOSIS — O021 Missed abortion: Secondary | ICD-10-CM | POA: Diagnosis not present

## 2021-04-02 HISTORY — PX: DILATION AND EVACUATION: SHX1459

## 2021-04-02 LAB — POCT I-STAT, CHEM 8
BUN: 4 mg/dL — ABNORMAL LOW (ref 6–20)
Calcium, Ion: 1.12 mmol/L — ABNORMAL LOW (ref 1.15–1.40)
Chloride: 98 mmol/L (ref 98–111)
Creatinine, Ser: 0.6 mg/dL (ref 0.44–1.00)
Glucose, Bld: 91 mg/dL (ref 70–99)
HCT: 38 % (ref 36.0–46.0)
Hemoglobin: 12.9 g/dL (ref 12.0–15.0)
Potassium: 2.9 mmol/L — ABNORMAL LOW (ref 3.5–5.1)
Sodium: 137 mmol/L (ref 135–145)
TCO2: 29 mmol/L (ref 22–32)

## 2021-04-02 LAB — TYPE AND SCREEN
ABO/RH(D): A POS
Antibody Screen: NEGATIVE

## 2021-04-02 SURGERY — DILATION AND EVACUATION, UTERUS
Anesthesia: General

## 2021-04-02 MED ORDER — SCOPOLAMINE 1 MG/3DAYS TD PT72
1.0000 | MEDICATED_PATCH | TRANSDERMAL | Status: DC
  Administered 2021-04-02: 1.5 mg via TRANSDERMAL
  Filled 2021-04-02: qty 1

## 2021-04-02 MED ORDER — KETOROLAC TROMETHAMINE 30 MG/ML IJ SOLN: 30.0000 mg | Freq: Once | INTRAMUSCULAR | Status: DC | PRN

## 2021-04-02 MED ORDER — ONDANSETRON HCL 4 MG/2ML IJ SOLN
INTRAMUSCULAR | Status: AC
  Filled 2021-04-02: qty 2

## 2021-04-02 MED ORDER — KETOROLAC TROMETHAMINE 30 MG/ML IJ SOLN
INTRAMUSCULAR | Status: AC
  Filled 2021-04-02: qty 1

## 2021-04-02 MED ORDER — ACETAMINOPHEN 500 MG PO TABS
1000.0000 mg | ORAL_TABLET | Freq: Once | ORAL | Status: AC
  Administered 2021-04-02: 1000 mg via ORAL
  Filled 2021-04-02: qty 2

## 2021-04-02 MED ORDER — OXYCODONE HCL 5 MG PO TABS: 5.0000 mg | ORAL_TABLET | Freq: Once | ORAL | Status: DC | PRN

## 2021-04-02 MED ORDER — KETOROLAC TROMETHAMINE 30 MG/ML IJ SOLN
INTRAMUSCULAR | Status: DC | PRN
Start: 2021-04-02 — End: 2021-04-02
  Administered 2021-04-02: 30 mg via INTRAVENOUS

## 2021-04-02 MED ORDER — LIDOCAINE 2% (20 MG/ML) 5 ML SYRINGE
INTRAMUSCULAR | Status: DC | PRN
  Administered 2021-04-02: 80 mg via INTRAVENOUS

## 2021-04-02 MED ORDER — PROPOFOL 10 MG/ML IV BOLUS
INTRAVENOUS | Status: DC | PRN
  Administered 2021-04-02: 150 mg via INTRAVENOUS

## 2021-04-02 MED ORDER — TRAMADOL HCL 50 MG PO TABS
50.0000 mg | ORAL_TABLET | Freq: Four times a day (QID) | ORAL | 0 refills | Status: AC | PRN
Start: 2021-04-02 — End: ?

## 2021-04-02 MED ORDER — CHLORHEXIDINE GLUCONATE 0.12 % MT SOLN
15.0000 mL | Freq: Once | OROMUCOSAL | Status: AC
  Administered 2021-04-02: 15 mL via OROMUCOSAL
  Filled 2021-04-02: qty 15

## 2021-04-02 MED ORDER — LIDOCAINE 2% (20 MG/ML) 5 ML SYRINGE
INTRAMUSCULAR | Status: AC
  Filled 2021-04-02: qty 10

## 2021-04-02 MED ORDER — MIDAZOLAM HCL 2 MG/2ML IJ SOLN
INTRAMUSCULAR | Status: AC
  Filled 2021-04-02: qty 2

## 2021-04-02 MED ORDER — 0.9 % SODIUM CHLORIDE (POUR BTL) OPTIME
TOPICAL | Status: DC | PRN
  Administered 2021-04-02: 1000 mL

## 2021-04-02 MED ORDER — POVIDONE-IODINE 10 % EX SWAB
2.0000 "application " | Freq: Once | CUTANEOUS | Status: AC
  Administered 2021-04-02: 2 via TOPICAL

## 2021-04-02 MED ORDER — HYDROMORPHONE HCL 1 MG/ML IJ SOLN: 0.2500 mg | INTRAMUSCULAR | Status: DC | PRN

## 2021-04-02 MED ORDER — MIDAZOLAM HCL 2 MG/2ML IJ SOLN
INTRAMUSCULAR | Status: DC | PRN
Start: 2021-04-02 — End: 2021-04-02
  Administered 2021-04-02: 2 mg via INTRAVENOUS

## 2021-04-02 MED ORDER — KETOROLAC TROMETHAMINE 15 MG/ML IJ SOLN
15.0000 mg | INTRAMUSCULAR | Status: DC
  Filled 2021-04-02: qty 1

## 2021-04-02 MED ORDER — DEXAMETHASONE SODIUM PHOSPHATE 10 MG/ML IJ SOLN
INTRAMUSCULAR | Status: DC | PRN
  Administered 2021-04-02: 10 mg via INTRAVENOUS

## 2021-04-02 MED ORDER — ONDANSETRON HCL 4 MG/2ML IJ SOLN
INTRAMUSCULAR | Status: DC | PRN
  Administered 2021-04-02: 4 mg via INTRAVENOUS

## 2021-04-02 MED ORDER — PROMETHAZINE HCL 25 MG/ML IJ SOLN: 6.2500 mg | INTRAMUSCULAR | Status: DC | PRN

## 2021-04-02 MED ORDER — FENTANYL CITRATE (PF) 250 MCG/5ML IJ SOLN
INTRAMUSCULAR | Status: DC | PRN
  Administered 2021-04-02: 50 ug via INTRAVENOUS

## 2021-04-02 MED ORDER — ORAL CARE MOUTH RINSE: 15.0000 mL | Freq: Once | OROMUCOSAL | Status: AC

## 2021-04-02 MED ORDER — MEPERIDINE HCL 25 MG/ML IJ SOLN: 6.2500 mg | INTRAMUSCULAR | Status: DC | PRN

## 2021-04-02 MED ORDER — OXYCODONE HCL 5 MG/5ML PO SOLN: 5.0000 mg | Freq: Once | ORAL | Status: DC | PRN

## 2021-04-02 MED ORDER — BUPIVACAINE HCL (PF) 0.5 % IJ SOLN
INTRAMUSCULAR | Status: AC
  Filled 2021-04-02: qty 30

## 2021-04-02 MED ORDER — FENTANYL CITRATE (PF) 250 MCG/5ML IJ SOLN
INTRAMUSCULAR | Status: AC
  Filled 2021-04-02: qty 5

## 2021-04-02 MED ORDER — BUPIVACAINE HCL (PF) 0.5 % IJ SOLN
INTRAMUSCULAR | Status: DC | PRN
  Administered 2021-04-02: 10 mL

## 2021-04-02 MED ORDER — DEXMEDETOMIDINE (PRECEDEX) IN NS 20 MCG/5ML (4 MCG/ML) IV SYRINGE
PREFILLED_SYRINGE | INTRAVENOUS | Status: DC | PRN
  Administered 2021-04-02: 20 ug via INTRAVENOUS

## 2021-04-02 MED ORDER — LACTATED RINGERS IV SOLN: INTRAVENOUS | Status: DC

## 2021-04-02 MED ORDER — ACETAMINOPHEN 500 MG PO TABS: 1000.0000 mg | ORAL_TABLET | ORAL | Status: DC

## 2021-04-02 MED ORDER — DOXYCYCLINE HYCLATE 100 MG IV SOLR
200.0000 mg | INTRAVENOUS | Status: AC
  Administered 2021-04-02: 200 mg via INTRAVENOUS
  Filled 2021-04-02: qty 200

## 2021-04-02 MED ORDER — PROPOFOL 500 MG/50ML IV EMUL
INTRAVENOUS | Status: DC | PRN
Start: 2021-04-02 — End: 2021-04-02
  Administered 2021-04-02: 25 ug/kg/min via INTRAVENOUS

## 2021-04-02 MED ORDER — AMISULPRIDE (ANTIEMETIC) 5 MG/2ML IV SOLN: 10.0000 mg | Freq: Once | INTRAVENOUS | Status: DC | PRN

## 2021-04-02 SURGICAL SUPPLY — 22 items
CATH ROBINSON RED A/P 16FR (CATHETERS) ×2 IMPLANT
DECANTER SPIKE VIAL GLASS SM (MISCELLANEOUS) IMPLANT
FILTER UTR ASPR ASSEMBLY (MISCELLANEOUS) ×2 IMPLANT
GLOVE SURG ENC MOIS LTX SZ6.5 (GLOVE) ×2 IMPLANT
GLOVE SURG UNDER POLY LF SZ7 (GLOVE) ×4 IMPLANT
GOWN STRL REUS W/ TWL LRG LVL3 (GOWN DISPOSABLE) ×2 IMPLANT
GOWN STRL REUS W/TWL LRG LVL3 (GOWN DISPOSABLE) ×2
HOSE CONNECTING 18IN BERKELEY (TUBING) ×2 IMPLANT
KIT BERKELEY 1ST TRI 3/8 NO TR (MISCELLANEOUS) ×2 IMPLANT
KIT BERKELEY 1ST TRIMESTER 3/8 (MISCELLANEOUS) ×2 IMPLANT
NS IRRIG 1000ML POUR BTL (IV SOLUTION) ×2 IMPLANT
PACK VAGINAL MINOR WOMEN LF (CUSTOM PROCEDURE TRAY) ×2 IMPLANT
PAD ABD 8X10 STRL (GAUZE/BANDAGES/DRESSINGS) ×1 IMPLANT
PAD OB MATERNITY 4.3X12.25 (PERSONAL CARE ITEMS) ×2 IMPLANT
SET BERKELEY SUCTION TUBING (SUCTIONS) IMPLANT
TOWEL GREEN STERILE FF (TOWEL DISPOSABLE) ×4 IMPLANT
UNDERPAD 30X36 HEAVY ABSORB (UNDERPADS AND DIAPERS) ×2 IMPLANT
VACURETTE 10 RIGID CVD (CANNULA) IMPLANT
VACURETTE 7MM CVD STRL WRAP (CANNULA) IMPLANT
VACURETTE 8 RIGID CVD (CANNULA) IMPLANT
VACURETTE 8MM F TIP (MISCELLANEOUS) ×2 IMPLANT
VACURETTE 9 RIGID CVD (CANNULA) IMPLANT

## 2021-04-02 NOTE — Op Note (Signed)
Kenn File PROCEDURE DATE: 04/02/2021  PREOPERATIVE DIAGNOSIS: 10 week missed abortion POSTOPERATIVE DIAGNOSIS: The same PROCEDURE:     Dilation and Evacuation SURGEON:  Emeterio Reeve MD  INDICATIONS: 36 y.o. [id number] with MAB at [redacted]w[redacted]d  weeks gestation, needing surgical completion.  Risks of surgery were discussed with the patient including but not limited to: bleeding which may require transfusion; infection which may require antibiotics; injury to uterus or surrounding organs; need for additional procedures including laparotomy or laparoscopy; possibility of intrauterine scarring which may impair future fertility; and other postoperative/anesthesia complications. Written informed consent was obtained.    FINDINGS:  A 8 week size uterus, moderate amounts of products of conception, specimen sent to pathology.  ANESTHESIA:    Monitored intravenous sedation, paracervical block. INTRAVENOUS FLUIDS:  500 ml of LR ESTIMATED BLOOD LOSS:  Less than 20 ml. SPECIMENS:  Products of conception sent to pathology COMPLICATIONS:  None immediate.  PROCEDURE DETAILS:  The patient received intravenous Doxycycline while in the preoperative area.  She was then taken to the operating room where monitored intravenous sedation was administered and was found to be adequate.  After an adequate timeout was performed, she was placed in the dorsal lithotomy position and examined; then prepped and draped in the sterile manner.   Her bladder was catheterized for an unmeasured amount of clear, yellow urine. A vaginal speculum was then placed in the patient's vagina and a single tooth tenaculum was applied to the anterior lip of the cervix.  A paracervical block using 10 ml of 0.5% Marcaine was administered. The cervix was gently dilated to accommodate a 8 mm suction curette that was gently advanced to the uterine fundus.  The suction device was then activated and curette slowly rotated to clear the uterus of products of  conception.   There was minimal bleeding noted and the tenaculum removed with good hemostasis noted.   All instruments were removed from the patient's vagina.  Sponge and instrument counts were correct times two  The patient tolerated the procedure well and was taken to the recovery area awake, and in stable condition.   Woodroe Mode, MD 04/02/2021 2:54 PM

## 2021-04-02 NOTE — Transfer of Care (Signed)
Immediate Anesthesia Transfer of Care Note  Patient: Latasha Carlson  Procedure(s) Performed: SUCTION DILATATION AND EVACUATION  Patient Location: PACU  Anesthesia Type:General  Level of Consciousness: awake, alert  and oriented  Airway & Oxygen Therapy: Patient Spontanous Breathing  Post-op Assessment: Report given to RN and Post -op Vital signs reviewed and stable  Post vital signs: Reviewed and stable  Last Vitals:  Vitals Value Taken Time  BP 100/63 04/02/21 1458  Temp    Pulse 131 04/02/21 1459  Resp 23 04/02/21 1459  SpO2 100 % 04/02/21 1459  Vitals shown include unvalidated device data.  Last Pain:  Vitals:   04/02/21 1249  TempSrc:   PainSc: 0-No pain         Complications: No notable events documented.

## 2021-04-02 NOTE — Anesthesia Postprocedure Evaluation (Signed)
Anesthesia Post Note  Patient: Latasha Carlson  Procedure(s) Performed: SUCTION DILATATION AND EVACUATION     Patient location during evaluation: PACU Anesthesia Type: General Level of consciousness: awake and alert Pain management: pain level controlled Vital Signs Assessment: post-procedure vital signs reviewed and stable Respiratory status: spontaneous breathing, nonlabored ventilation, respiratory function stable and patient connected to nasal cannula oxygen Cardiovascular status: blood pressure returned to baseline and stable Postop Assessment: no apparent nausea or vomiting Anesthetic complications: no   No notable events documented.  Last Vitals:  Vitals:   04/02/21 1500 04/02/21 1515  BP: 100/63 (!) 97/59  Pulse: (!) 131 65  Resp: 18 19  Temp: 36.6 C   SpO2: 100% 100%    Last Pain:  Vitals:   04/02/21 1249  TempSrc:   PainSc: 0-No pain                 Chellsea Beckers S

## 2021-04-02 NOTE — Anesthesia Procedure Notes (Signed)
Procedure Name: LMA Insertion Date/Time: 04/02/2021 2:20 PM Performed by: Darryl Nestle, CRNA Pre-anesthesia Checklist: Patient identified, Emergency Drugs available, Suction available and Patient being monitored Patient Re-evaluated:Patient Re-evaluated prior to induction Oxygen Delivery Method: Circle system utilized Preoxygenation: Pre-oxygenation with 100% oxygen Induction Type: IV induction LMA: LMA inserted LMA Size: 4.0 Tube type: Oral Number of attempts: 1 Placement Confirmation: positive ETCO2 and breath sounds checked- equal and bilateral Tube secured with: Tape Dental Injury: Teeth and Oropharynx as per pre-operative assessment

## 2021-04-02 NOTE — Interval H&P Note (Signed)
History and Physical Interval Note:  04/02/2021 12:09 PM  Latasha Carlson  has presented today for surgery, with the diagnosis of MAB.  The various methods of treatment have been discussed with the patient and family. After consideration of risks, benefits and other options for treatment, the patient has consented to  Procedure(s): DILATATION AND EVACUATION (N/A) as a surgical intervention.  The patient's history has been reviewed, patient examined, no change in status, stable for surgery.  I have reviewed the patient's chart and labs.  Questions were answered to the patient's satisfaction.     Scheryl Darter

## 2021-04-03 ENCOUNTER — Encounter (HOSPITAL_COMMUNITY): Payer: Self-pay | Admitting: Obstetrics & Gynecology

## 2021-04-05 LAB — SURGICAL PATHOLOGY

## 2021-04-09 ENCOUNTER — Encounter: Payer: Medicaid Other | Admitting: Obstetrics

## 2021-04-26 ENCOUNTER — Encounter: Payer: Self-pay | Admitting: Obstetrics

## 2021-05-05 ENCOUNTER — Ambulatory Visit (INDEPENDENT_AMBULATORY_CARE_PROVIDER_SITE_OTHER): Payer: Medicaid Other | Admitting: Obstetrics & Gynecology

## 2021-05-05 ENCOUNTER — Other Ambulatory Visit: Payer: Self-pay

## 2021-05-05 ENCOUNTER — Encounter: Payer: Self-pay | Admitting: Obstetrics & Gynecology

## 2021-05-05 VITALS — BP 118/79 | HR 98 | Ht 68.0 in | Wt 194.0 lb

## 2021-05-05 DIAGNOSIS — Z9889 Other specified postprocedural states: Secondary | ICD-10-CM

## 2021-05-05 DIAGNOSIS — Z8759 Personal history of other complications of pregnancy, childbirth and the puerperium: Secondary | ICD-10-CM

## 2021-05-05 NOTE — Progress Notes (Signed)
Subjective:feels well and ready to return to work     Skyelar Halliday is a 36 y.o. female who presents to the clinic 4 weeks status post  suction D&C  for  missed abortion . Eating a regular diet without difficulty. Bowel movements are normal. The patient is not having any pain.  The following portions of the patient's history were reviewed and updated as appropriate: allergies, current medications, past family history, past medical history, past social history, past surgical history, and problem list.  Review of Systems Pertinent items are noted in HPI.    Objective:    BP 118/79 (BP Location: Right Arm, Cuff Size: Large)    Pulse 98    Ht 5\' 8"  (1.727 m)    Wt 194 lb (88 kg)    LMP 01/16/2021    BMI 29.50 kg/m  General:  alert, cooperative, and no distress  Abdomen: soft, non-tender  Incision:   N/a    Pelvic not indicated Assessment:    Doing well postoperatively. Operative findings again reviewed. Pathology report discussed.    Plan:    1. Continue any current medications. 2. Wound care discussed. 3. Activity restrictions: none 4. Anticipated return to work: now. 5. Follow up: as needed  She requests no BCM 01/18/2021, MD    Patient ID: Adam Phenix, female   DOB: 10-05-1985, 36 y.o.   MRN: [id number]

## 2021-05-05 NOTE — Progress Notes (Signed)
GYN presnts for Post-OP FU. She needs to be released for work.

## 2021-08-15 IMAGING — CT CT NECK W/ CM
4 of 5 series · 14 of 33 positions shown, 16 images · IV contrast (APPLIED)
Comparison: None available.

CLINICAL DATA: Initial evaluation for sublingual/submandibular
abscess.

EXAM:
CT NECK WITH CONTRAST
TECHNIQUE: Multidetector CT imaging of the neck was performed using the
standard protocol following the bolus administration of intravenous
contrast.
CONTRAST:  75mL OMNIPAQUE IOHEXOL 300 MG/ML  SOLN

[Series 3: neck 2.0 i31s 3 · axial · 0.44mm/px · z∈[-204,-76]mm · 4 of 108 slices shown, 5 images]
[im 22/108  soft-tissue]
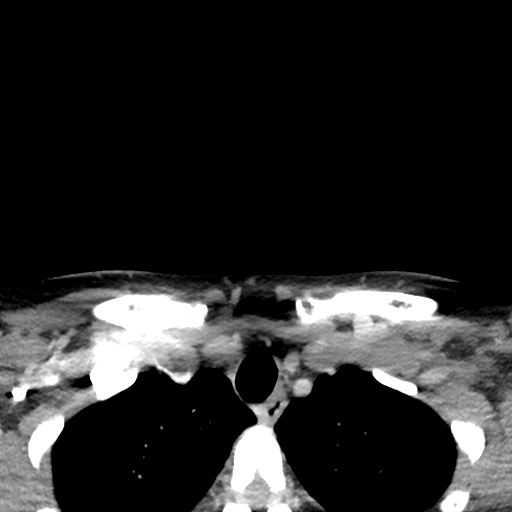
[im 22/108  bone]
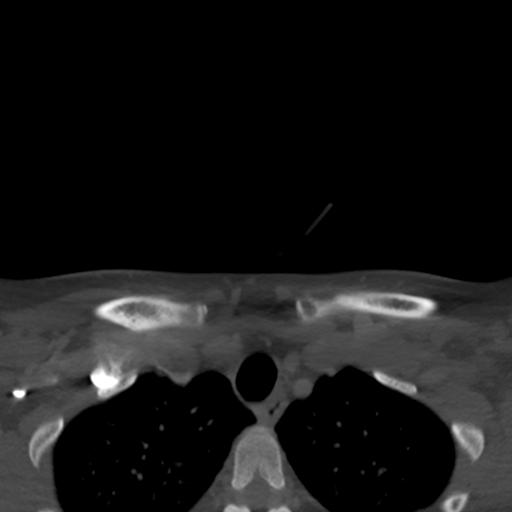
[im 43/108  bone]
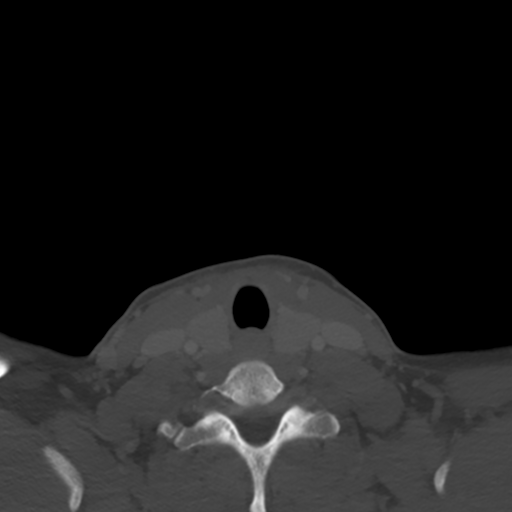
[im 65/108  bone]
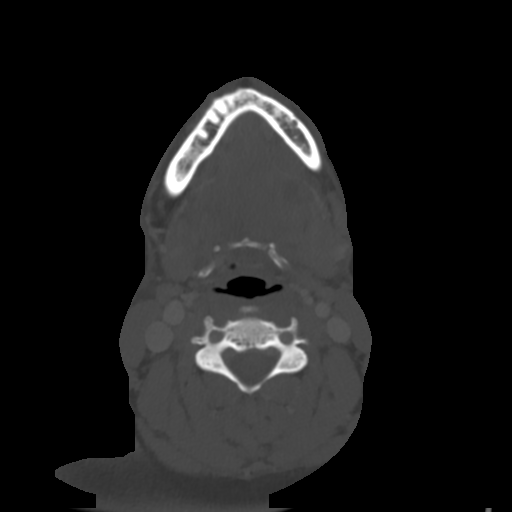
[im 86/108  bone]
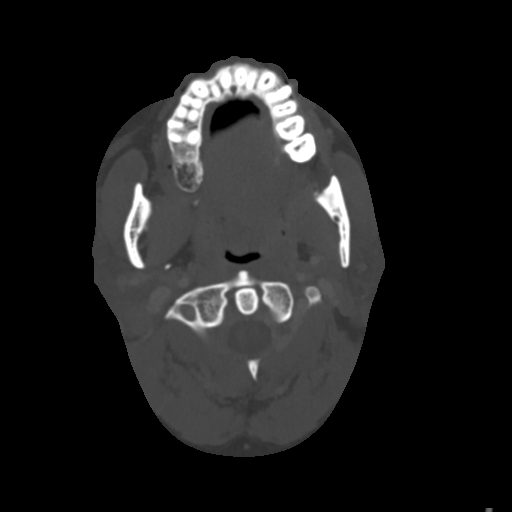

[Series 7: coronal st · coronal · 0.42mm/px · 3 of 111 slices shown]
[im 23/111  bone]
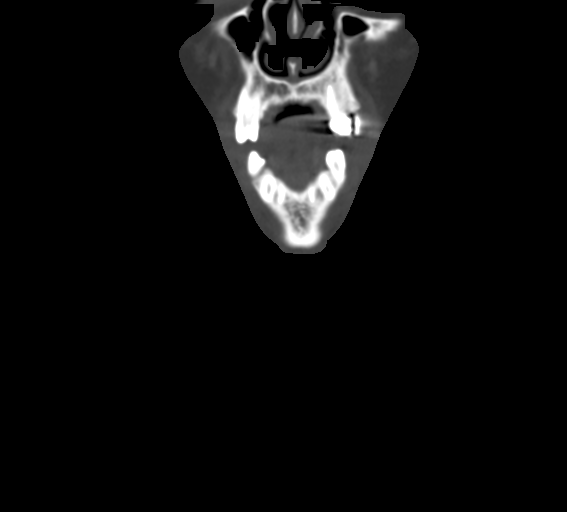
[im 45/111  bone]
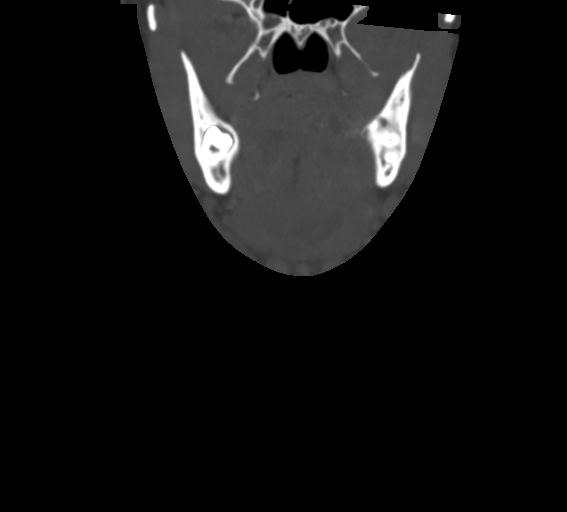
[im 67/111  bone]
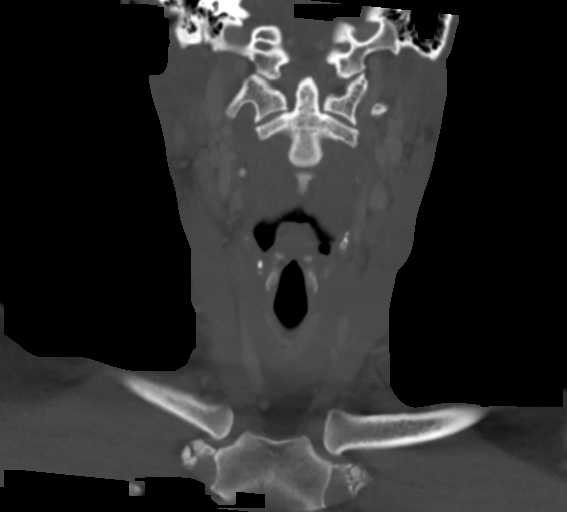

[Series 8: sagittal st · sagittal · 0.42mm/px · 5 of 101 slices shown, 6 images]
[im 34/101  bone]
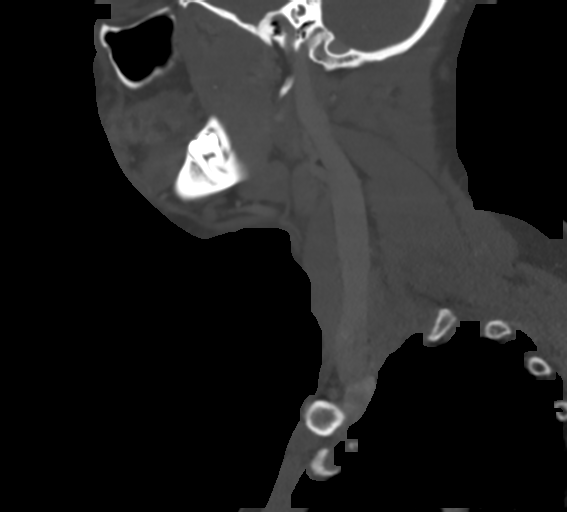
[im 42/101  bone]
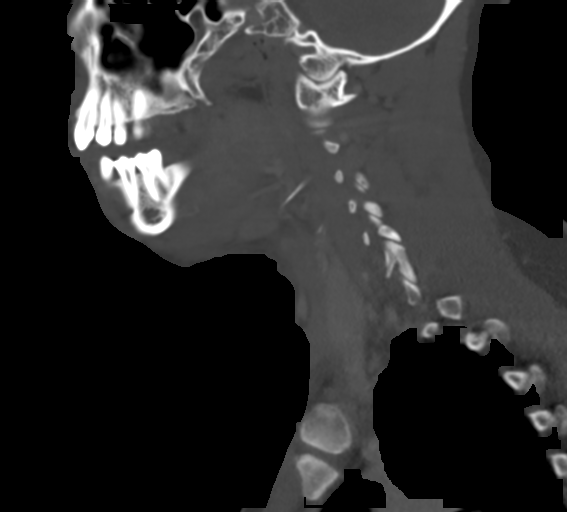
[im 51/101  soft-tissue]
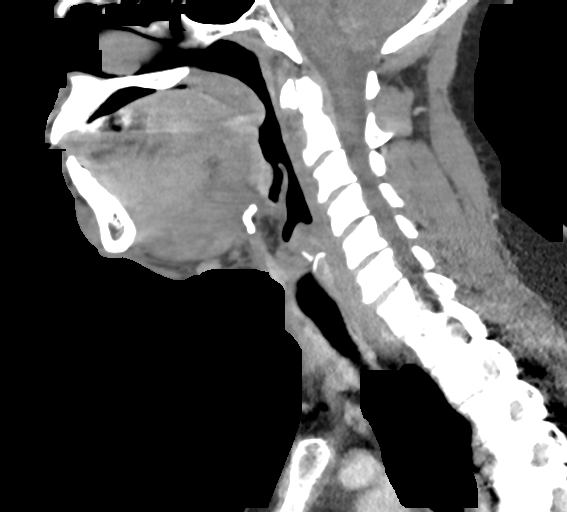
[im 51/101  bone]
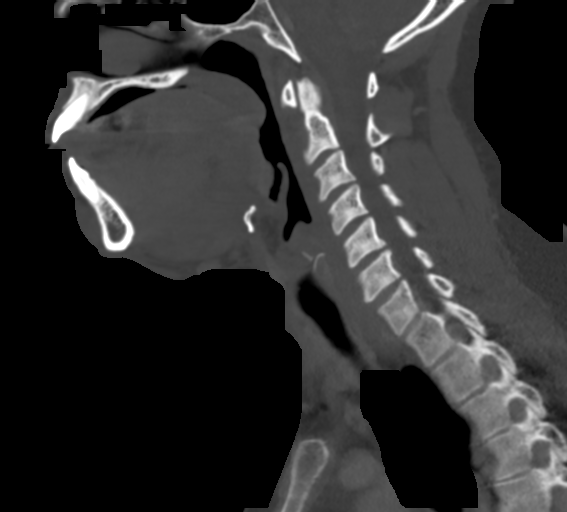
[im 59/101  bone]
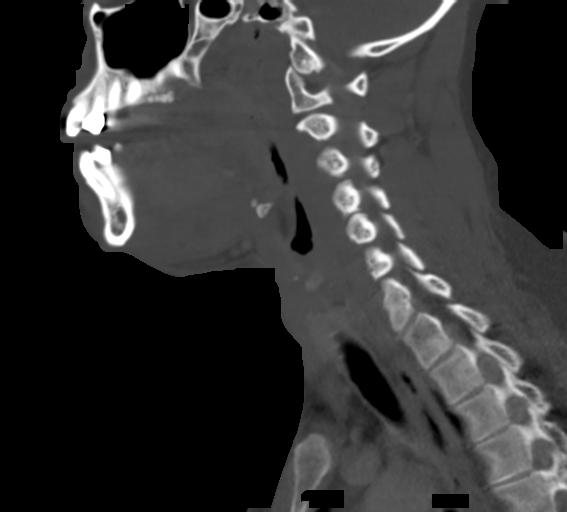
[im 67/101  bone]
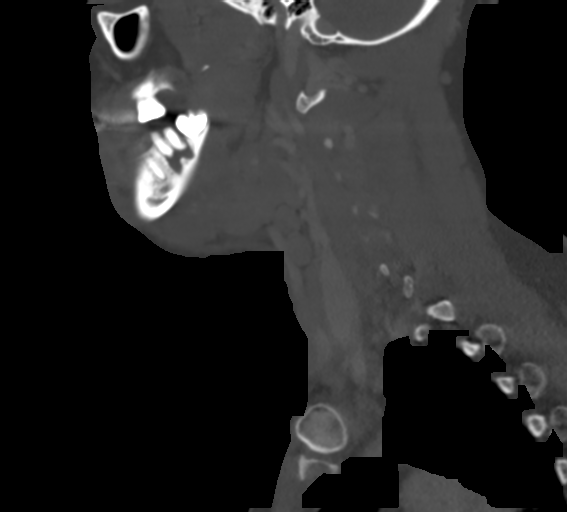

[Series 9: orthogonal st · axial · 0.39mm/px · z∈[-248,-212]mm · 2 of 101 slices shown]
[im 21/101  bone]
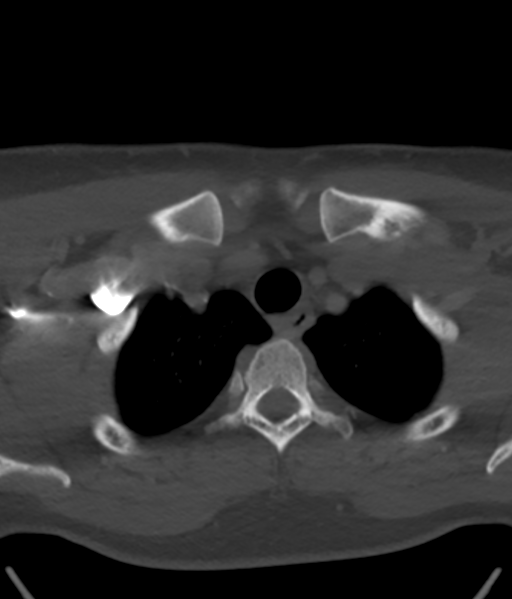
[im 41/101  bone]
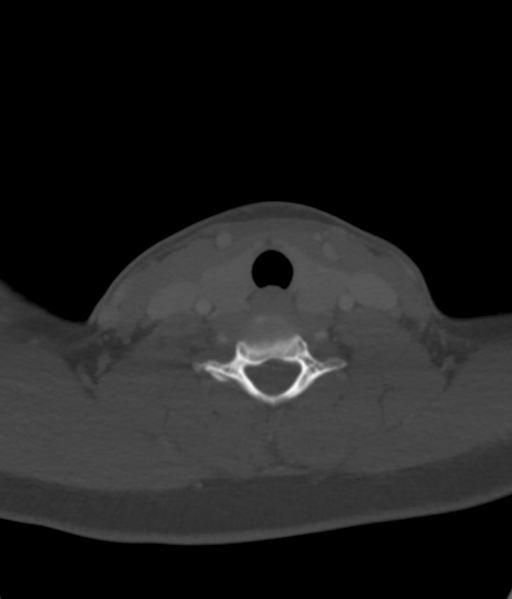

[14 of 33 positions shown; findings below may reference images not displayed]

FINDINGS: Pharynx and larynx: Prominent dental [place] with periapical lucency
seen involving the left second mandibular molar. Associated focal
dehiscence of the adjacent alveolar ridge along the lingual aspect
of the mandible (series 6, image 34). There is a hypodense somewhat
tubular collection emanating from the left mandibular body at this
level, extending inferiorly and medially into the floor of mouth
(series 7, images 40, 42, 46). The dominant abscess position along
the midline at the floor of mouth measures 2.8 x 1.8 x 0.8 cm (AP by
transverse by craniocaudad, series 3, image 51). Surrounding
swelling with inflammatory changes within the adjacent floor of
mouth and sublingual space, extending into the submandibular spaces
bilaterally, left slightly worse than right. The oral tongue appears
somewhat lifted. Hazy inflammatory stranding involves the submental
region as well.

Palatine tonsils symmetric and within normal limits. Parapharyngeal
fat relatively well maintained. Remainder of the oropharynx and
nasopharynx within normal limits. No retropharyngeal collection or
swelling. Epiglottis normal. Vallecula clear. Remainder of the
hypopharynx and supraglottic larynx within normal limits. Glottis
normal. Subglottic airway widely patent and clear.

Salivary glands: Parotid glands within normal limits. Hazy
inflammatory stranding surrounds the submandibular glands
bilaterally related to the adjacent inflammatory process at the
floor of mouth. The glands themselves are within normal limits
without evidence for sialolithiasis or acute sialoadenitis.

Thyroid: Normal.

Lymph nodes: Prominent left level IB/II nodes measure up to 12 mm.
Prominent submental nodes measure up to 7 mm. Findings presumably
reactive. No other pathologically enlarged lymph nodes seen within
the neck.

Vascular: Normal intravascular enhancement seen throughout the neck.

Limited intracranial: Unremarkable.

Visualized orbits: Visualized globes and orbital soft tissues within
normal limits.

Mastoids and visualized paranasal sinuses: Visualized paranasal
sinuses are largely clear. Visualize mastoids and middle ear
cavities are well pneumatized and free of fluid.

Skeleton: No acute osseous abnormality. No discrete or worrisome
osseous lesions.

Upper chest: Visualized upper chest demonstrates no acute finding.
Partially visualized lungs are clear.

Other: None.
IMPRESSION: 1. Prominent dental [place] with periapical lucency involving the left
second mandibular molar. Associated 2.8 x 1.8 x 0.8 cm abscess
emanating from the base of the carious tooth, extending inferiorly
and medially into the floor of mouth as above. Surrounding swelling
with inflammatory changes within the adjacent floor of mouth and
sublingual space, extending into the submandibular spaces and
submental region bilaterally.
2. Mildly enlarged left-sided cervical adenopathy as above,
presumably reactive.

## 2022-06-08 DIAGNOSIS — J019 Acute sinusitis, unspecified: Secondary | ICD-10-CM | POA: Diagnosis not present

## 2022-06-08 DIAGNOSIS — R051 Acute cough: Secondary | ICD-10-CM | POA: Diagnosis not present

## 2022-06-08 DIAGNOSIS — J309 Allergic rhinitis, unspecified: Secondary | ICD-10-CM | POA: Diagnosis not present

## 2022-06-08 DIAGNOSIS — B9689 Other specified bacterial agents as the cause of diseases classified elsewhere: Secondary | ICD-10-CM | POA: Diagnosis not present
